# Patient Record
Sex: Male | Born: 1945
Health system: Southern US, Community
[De-identification: ages and names within clinical notes are randomized; demographics above are authoritative.]

## PROBLEM LIST (undated history)

## (undated) DIAGNOSIS — R011 Cardiac murmur, unspecified: Secondary | ICD-10-CM

## (undated) DIAGNOSIS — J45909 Unspecified asthma, uncomplicated: Secondary | ICD-10-CM

## (undated) DIAGNOSIS — M1990 Unspecified osteoarthritis, unspecified site: Secondary | ICD-10-CM

## (undated) DIAGNOSIS — I10 Essential (primary) hypertension: Secondary | ICD-10-CM

## (undated) DIAGNOSIS — Z5189 Encounter for other specified aftercare: Secondary | ICD-10-CM

## (undated) DIAGNOSIS — N529 Male erectile dysfunction, unspecified: Secondary | ICD-10-CM

## (undated) HISTORY — DX: Cardiac murmur, unspecified: R01.1

## (undated) HISTORY — PX: FOOT SURGERY: SHX648

## (undated) HISTORY — DX: Unspecified osteoarthritis, unspecified site: M19.90

## (undated) HISTORY — DX: Unspecified asthma, uncomplicated: J45.909

## (undated) HISTORY — PX: TONSILLECTOMY: SUR1361

## (undated) HISTORY — DX: Encounter for other specified aftercare: Z51.89

## (undated) HISTORY — PX: COLONOSCOPY: SHX174

## (undated) HISTORY — DX: Essential (primary) hypertension: I10

## (undated) HISTORY — PX: PILONIDAL CYST EXCISION: SHX744

## (undated) HISTORY — DX: Male erectile dysfunction, unspecified: N52.9

## (undated) HISTORY — PX: OTHER SURGICAL HISTORY: SHX169

---

## 1996-06-14 HISTORY — PX: CERVICAL DISC SURGERY: SHX588

## 1999-08-18 ENCOUNTER — Encounter (INDEPENDENT_AMBULATORY_CARE_PROVIDER_SITE_OTHER): Payer: Self-pay | Admitting: Internal Medicine

## 1999-08-18 LAB — CONVERTED CEMR LAB: PSA: 1.2 ng/mL

## 2003-08-15 DIAGNOSIS — Z5189 Encounter for other specified aftercare: Secondary | ICD-10-CM

## 2003-08-15 HISTORY — DX: Encounter for other specified aftercare: Z51.89

## 2003-09-15 HISTORY — PX: PELVIC FRACTURE SURGERY: SHX119

## 2003-10-11 ENCOUNTER — Inpatient Hospital Stay (HOSPITAL_COMMUNITY): Admission: EM | Admit: 2003-10-11 | Discharge: 2003-10-19 | Payer: Self-pay | Admitting: Emergency Medicine

## 2003-10-27 ENCOUNTER — Ambulatory Visit (HOSPITAL_COMMUNITY): Admission: RE | Admit: 2003-10-27 | Discharge: 2003-10-27 | Payer: Self-pay | Admitting: General Surgery
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
## 2005-11-09 ENCOUNTER — Ambulatory Visit: Payer: Self-pay | Admitting: Family Medicine

## 2005-11-15 ENCOUNTER — Ambulatory Visit: Payer: Self-pay | Admitting: Family Medicine

## 2005-11-15 ENCOUNTER — Encounter (INDEPENDENT_AMBULATORY_CARE_PROVIDER_SITE_OTHER): Payer: Self-pay | Admitting: Internal Medicine

## 2005-12-12 ENCOUNTER — Ambulatory Visit: Payer: Self-pay | Admitting: Family Medicine

## 2006-06-14 ENCOUNTER — Ambulatory Visit: Payer: Self-pay | Admitting: Family Medicine

## 2006-09-10 ENCOUNTER — Ambulatory Visit: Payer: Self-pay | Admitting: Family Medicine

## 2006-12-05 ENCOUNTER — Ambulatory Visit: Payer: Self-pay | Admitting: Family Medicine

## 2006-12-06 ENCOUNTER — Encounter: Payer: Self-pay | Admitting: Internal Medicine

## 2006-12-06 DIAGNOSIS — I10 Essential (primary) hypertension: Secondary | ICD-10-CM

## 2006-12-10 ENCOUNTER — Ambulatory Visit: Payer: Self-pay | Admitting: Family Medicine

## 2006-12-10 DIAGNOSIS — L02519 Cutaneous abscess of unspecified hand: Secondary | ICD-10-CM | POA: Insufficient documentation

## 2006-12-10 DIAGNOSIS — L03019 Cellulitis of unspecified finger: Secondary | ICD-10-CM

## 2007-08-30 ENCOUNTER — Ambulatory Visit: Payer: Self-pay | Admitting: Family Medicine

## 2007-08-30 DIAGNOSIS — F528 Other sexual dysfunction not due to a substance or known physiological condition: Secondary | ICD-10-CM

## 2007-08-30 DIAGNOSIS — N529 Male erectile dysfunction, unspecified: Secondary | ICD-10-CM | POA: Insufficient documentation

## 2007-08-30 DIAGNOSIS — R42 Dizziness and giddiness: Secondary | ICD-10-CM

## 2007-09-03 ENCOUNTER — Telehealth (INDEPENDENT_AMBULATORY_CARE_PROVIDER_SITE_OTHER): Payer: Self-pay | Admitting: *Deleted

## 2007-09-20 ENCOUNTER — Encounter (INDEPENDENT_AMBULATORY_CARE_PROVIDER_SITE_OTHER): Payer: Self-pay | Admitting: Internal Medicine

## 2008-09-22 ENCOUNTER — Ambulatory Visit: Payer: Self-pay | Admitting: Family Medicine

## 2008-09-22 DIAGNOSIS — D692 Other nonthrombocytopenic purpura: Secondary | ICD-10-CM | POA: Insufficient documentation

## 2008-09-24 LAB — CONVERTED CEMR LAB
ALT: 16 units/L (ref 0–53)
Albumin: 3.7 g/dL (ref 3.5–5.2)
Basophils Relative: 0.6 % (ref 0.0–3.0)
CO2: 32 meq/L (ref 19–32)
Calcium: 9.1 mg/dL (ref 8.4–10.5)
Cholesterol: 226 mg/dL (ref 0–200)
Creatinine, Ser: 1.1 mg/dL (ref 0.4–1.5)
Direct LDL: 150.8 mg/dL
GFR calc Af Amer: 87 mL/min
Glucose, Bld: 92 mg/dL (ref 70–99)
HCT: 37.2 % — ABNORMAL LOW (ref 39.0–52.0)
Hemoglobin: 12.5 g/dL — ABNORMAL LOW (ref 13.0–17.0)
Lymphocytes Relative: 23.7 % (ref 12.0–46.0)
MCHC: 33.5 g/dL (ref 30.0–36.0)
Monocytes Absolute: 0.5 10*3/uL (ref 0.1–1.0)
Monocytes Relative: 10.6 % (ref 3.0–12.0)
Neutro Abs: 2.8 10*3/uL (ref 1.4–7.7)
RBC: 4.1 M/uL — ABNORMAL LOW (ref 4.22–5.81)
Total CHOL/HDL Ratio: 3.7
Total Protein: 6.6 g/dL (ref 6.0–8.3)
VLDL: 8 mg/dL (ref 0–40)
aPTT: 28.9 s (ref 21.7–29.8)

## 2009-03-23 ENCOUNTER — Ambulatory Visit: Payer: Self-pay | Admitting: Family Medicine

## 2009-03-24 LAB — CONVERTED CEMR LAB
CO2: 32 meq/L (ref 19–32)
Calcium: 9 mg/dL (ref 8.4–10.5)
Chloride: 103 meq/L (ref 96–112)
Potassium: 4.5 meq/L (ref 3.5–5.1)
Sodium: 141 meq/L (ref 135–145)
Total CHOL/HDL Ratio: 4

## 2009-07-28 ENCOUNTER — Encounter (INDEPENDENT_AMBULATORY_CARE_PROVIDER_SITE_OTHER): Payer: Self-pay | Admitting: *Deleted

## 2009-09-28 ENCOUNTER — Ambulatory Visit: Payer: Self-pay | Admitting: Family Medicine

## 2009-09-29 LAB — CONVERTED CEMR LAB
Albumin: 3.9 g/dL (ref 3.5–5.2)
Bilirubin, Direct: 0.1 mg/dL (ref 0.0–0.3)
CO2: 32 meq/L (ref 19–32)
Calcium: 9.2 mg/dL (ref 8.4–10.5)
Cholesterol: 217 mg/dL — ABNORMAL HIGH (ref 0–200)
Creatinine, Ser: 1.2 mg/dL (ref 0.4–1.5)
Direct LDL: 143.6 mg/dL
HDL: 69.2 mg/dL (ref >39.00)
Total CHOL/HDL Ratio: 3
Total Protein: 6.8 g/dL (ref 6.0–8.3)
Triglycerides: 29 mg/dL (ref 0.0–149.0)

## 2010-03-30 ENCOUNTER — Telehealth: Payer: Self-pay | Admitting: Gastroenterology

## 2010-09-13 NOTE — Progress Notes (Signed)
Summary: Schedule Colonoscopy  Phone Note Outgoing Call Call back at Home Phone [phone]   Call placed by: Harlow Mares CMA [facility]),  March 30, 2010 11:15 AM Call placed to: Patient Summary of Call: spoke to the patients wife and she will have the patient call back to schedule his colonoscopy i gave her the number and Dr. Anselm Jungling number.  Initial call taken by: Harlow Mares CMA [facility]),  March 30, 2010 11:16 AM

## 2010-09-13 NOTE — Assessment & Plan Note (Signed)
Summary: F/U,Scott Brennan/CLE   Vital Signs:  Patient profile:   65 year old male Height:      70.25 inches Weight:      177.25 pounds BMI:     25.34 Temp:     98.2 degrees F oral Pulse rate:   84 / minute Pulse rhythm:   regular BP sitting:   142 / 62  (left arm) Cuff size:   regular  Vitals Entered By: Scott Brennan CMA Scott Brennan) (September 28, 2009 8:17 AM) CC: Follow-up visit  (BDB)   History of Present Illness: Here for follow up of HTN.  HTN- mildly elevated today.  Gave blood two days ago and BP was checked multiple times, each time 120s/70s-80s.  No CP, SOB, blurred vision, or LE edema. On lisinopirl 5 mg daily.  Well man- due for FLP, Zostavax, colonscopy PSA. No family h/o colon CA.  No changes in bowels or blood in stool.  No changes in weight.  Very active man.  Walks 2.5 miles per day.  Quit smoking in 2005.  ED- has not used Viagra in years.  Not interested in refills.     Current Medications (verified): 1)  Lisinopril 5 Mg Tabs (Lisinopril) .... Take  One By Mouth Daily 2)  Viagra 100 Mg  Tabs (Sildenafil Citrate) .Marland Kitchen.. 1 Once Daily As Needed  Allergies: 1)  ! * Ivp Dye  Review of Systems      See HPI General:  Denies malaise. Eyes:  Denies blurring. ENT:  Denies difficulty swallowing. CV:  Denies chest pain or discomfort. Resp:  Denies shortness of breath. GI:  Denies abdominal pain. Derm:  Denies rash. Neuro:  Denies headaches. Psych:  Denies anxiety and depression.  Physical Exam  General:  alert, well-developed, well-nourished, and well-hydrated.   Eyes:  No corneal or conjunctival inflammation noted. EOMI. Perrla. Funduscopic exam benign, without hemorrhages, exudates or papilledema. Vision grossly normal. Mouth:  MMM Lungs:  normal respiratory effort, no intercostal retractions, no accessory muscle use, and normal breath sounds.   Heart:  normal rate, regular rhythm, and no murmur.   Extremities:  no edema Psych:  normally interactive and  good eye contact.     Impression & Recommendations:  Problem # 1:  HYPERTENSION (ICD-401.9) Assessment Unchanged mildly elevated today.  Advised to keep a log and call me with BP readings over the next couple of weeks.  Has a lot of room to go up on BP meds, on low dose Lisinopril currently. His updated medication list for this problem includes:    Lisinopril 5 Mg Tabs (Lisinopril) .Marland Kitchen... Take  one by mouth daily  Orders: Venipuncture (46962) TLB-BMP (Basic Metabolic Panel-BMET) (80048-METABOL)  Problem # 2:  SCREENING FOR LIPOID DISORDERS (ICD-V77.91) FLP today Orders: Venipuncture (95284) TLB-Lipid Panel (80061-LIPID) TLB-Hepatic/Liver Function Pnl (80076-HEPATIC)  Problem # 3:  SPECIAL SCREENING MALIGNANT NEOPLASM OF PROSTATE (ICD-V76.44) PSA today Orders: Venipuncture (13244) TLB-PSA (Prostate Specific Antigen) (84153-PSA)  Problem # 4:  Preventive Health Care (ICD-V70.0) Assessment: Comment Only due for colonoscopy, says he will do it in a few months.  Does not want to schedule right now.  Complete Medication List: 1)  Lisinopril 5 Mg Tabs (Lisinopril) .... Take  one by mouth daily 2)  Viagra 100 Mg Tabs (Sildenafil citrate) .Marland Kitchen.. 1 once daily as needed Prescriptions: LISINOPRIL 5 MG TABS (LISINOPRIL) Take  one by mouth daily  #30 x 6   Entered and Authorized by:   [name] MD   Signed by:  [name] MD on 09/28/2009   Method used:   [facility]        [facility] (retail[facility]       [address]       [place], [place]  [address]       Ph: [phone] or [phone]       Fax: [phone]   RxID:   [id number]   Current Allergies (reviewed today): ! * IVP DYE Colonoscopy Next Due:  Refused

## 2010-10-14 ENCOUNTER — Encounter: Payer: Self-pay | Admitting: Family Medicine

## 2010-10-14 ENCOUNTER — Ambulatory Visit (INDEPENDENT_AMBULATORY_CARE_PROVIDER_SITE_OTHER): Payer: BC Managed Care – PPO | Admitting: Family Medicine

## 2010-10-14 DIAGNOSIS — Z1211 Encounter for screening for malignant neoplasm of colon: Secondary | ICD-10-CM

## 2010-10-14 DIAGNOSIS — I10 Essential (primary) hypertension: Secondary | ICD-10-CM

## 2010-10-17 LAB — CONVERTED CEMR LAB
ALT: 15 units/L (ref 0–53)
AST: 23 units/L (ref 0–37)
Chloride: 101 meq/L (ref 96–112)
Creatinine, Ser: 1.05 mg/dL (ref 0.40–1.50)
LDL Cholesterol: 149 mg/dL — ABNORMAL HIGH (ref 0–99)
PSA: 0.69 ng/mL (ref <=4.00)
Total Bilirubin: 0.6 mg/dL (ref 0.3–1.2)
Total CHOL/HDL Ratio: 3.6
VLDL: 9 mg/dL (ref 0–40)

## 2010-10-18 ENCOUNTER — Encounter (INDEPENDENT_AMBULATORY_CARE_PROVIDER_SITE_OTHER): Payer: Self-pay | Admitting: *Deleted

## 2010-10-18 ENCOUNTER — Other Ambulatory Visit: Payer: Self-pay | Admitting: Family Medicine

## 2010-10-18 ENCOUNTER — Other Ambulatory Visit: Payer: BC Managed Care – PPO

## 2010-10-18 DIAGNOSIS — R195 Other fecal abnormalities: Secondary | ICD-10-CM

## 2010-10-18 LAB — FECAL OCCULT BLOOD, IMMUNOCHEMICAL: Fecal Occult Bld: NEGATIVE

## 2010-10-19 ENCOUNTER — Encounter: Payer: Self-pay | Admitting: Family Medicine

## 2010-10-20 NOTE — Letter (Signed)
Summary: [facility]: Immunoassay Fecal Occult Blood (iFOB) Order Form  [place] [facility]  [address] [place], [place] [address]   Phone: [phone]  Fax: [phone]       Lab: Immunoassay Fecal Occult Blood (iFOB) Order Form   October 14, 2010 MRN: [id number]   Scott Brennan 09-Dec-1945   Physicican Name:____Talia Aron_____________________  Diagnosis Code:_______792.1___________________      [name] MD

## 2010-10-20 NOTE — Assessment & Plan Note (Signed)
Summary: REFILL MEDICATIONS/CLE   [employer]   Vital Signs:  Patient profile:   65 year old male Height:      70.25 inches Weight:      179 pounds BMI:     25.59 Temp:     98.7 degrees F oral Pulse rate:   83 / minute Pulse rhythm:   regular BP sitting:   130 / 80  (right arm) Cuff size:   regular  Vitals Entered By: Linde Gillis CMA [facility]) (October 14, 2010 2:52 PM) CC: medication refill   History of Present Illness: Here for follow up of HTN.  HTN- Is normotensive today.    Gives blood frequently and  BP has been running 120s/-130s70s-80s.  No CP, SOB, blurred vision, or LE edema. On lisinopirl 5 mg daily.  Well man- due for FLP, Zostavax, colonscopy PSA. No family h/o colon CA.  No changes in bowels or blood in stool.  No changes in weight.  Referred him to GI last year but he is still very hesitant to have a colonscopy.    Very active man.  Walks 2.5 miles per day.  Quit smoking in 2005.  ED- has not used Viagra in years.  Not interested in refills.    Foot surgery- has bunyon removal and bone surgery 3 weeks ago, healing well.  Had follow up with [facility] yesterday(awaiting records)   Current Medications (verified): 1)  Lisinopril 5 Mg Tabs (Lisinopril) .... Take  One By Mouth Daily 2)  Viagra 100 Mg  Tabs (Sildenafil Citrate) .Marland Kitchen.. 1 Once Daily As Needed  Allergies: 1)  ! * Ivp Dye  Past History:  Past Medical History: Last updated: 12/06/2006 Hypertension  Past Surgical History: Last updated: 12/06/2006 Cervical disk surgery 11/97 Pilonidal cyst removed Hx fracturedpelvis, ribs, and punctured spleen(repaired)---2/05  Social History: Last updated: 09/22/2008 Marital Status: Married Children: 2--1 grand child Occupation: works 40h/wk at [employer]  Risk Factors: Caffeine Use: 5+ (09/22/2008) Exercise: no (09/22/2008)  Risk Factors: Smoking Status: quit (12/06/2006) Passive Smoke Exposure: no (09/22/2008)  Review of Systems      See  HPI General:  Denies malaise. Eyes:  Denies blurring. ENT:  Denies difficulty swallowing. CV:  Denies chest pain or discomfort. Resp:  Denies shortness of breath. GI:  Denies abdominal pain, bloody stools, and change in bowel habits. GU:  Denies dysuria, urinary frequency, and urinary hesitancy. MS:  Denies joint pain, joint redness, and joint swelling. Derm:  Denies rash. Neuro:  Denies headaches. Psych:  Denies anxiety and depression. Endo:  Denies cold intolerance and heat intolerance. Heme:  Denies abnormal bruising and bleeding.  Physical Exam  General:  alert, well-developed, well-nourished, and well-hydrated.   Head:  normocephalic and atraumatic.   Eyes:  No corneal or conjunctival inflammation noted. EOMI. Perrla. Funduscopic exam benign, without hemorrhages, exudates or papilledema. Vision grossly normal. Ears:  R ear normal and L ear normal.   Nose:  no external deformity.   Mouth:  MMM Neck:  No deformities, masses, or tenderness noted. Lungs:  normal respiratory effort, no intercostal retractions, no accessory muscle use, and normal breath sounds.   Heart:  normal rate, regular rhythm, and no murmur.   Abdomen:  Bowel sounds positive,abdomen soft and non-tender without masses, organomegaly or hernias noted. Rectal:  deferred- pt does want PSA Genitalia:  deferred Prostate:  deferred Msk:  in right foot boot Neurologic:  alert & oriented X3.   Skin:  Intact without suspicious lesions or rashes  Psych:  normally interactive and good eye contact.     Impression & Recommendations:  Problem # 1:  SPECIAL SCREENING MALIGNANT NEOPLASM OF PROSTATE (ICD-V76.44) Asymptomatic, will check PSA today.   Orders: T-PSA ([address])  Problem # 2:  HYPERTENSION (ICD-401.9) Assessment: Improved Has been normotensive on such a low dose and was very active prior to his foot surgery.  He anticpates returing to his normal physical activites soon.  Pt would like to try to stop the  Lisinopril and I agree that we can try that.  check BMET today. See pt instructions for details. His updated medication list for this problem includes:    Lisinopril 5 Mg Tabs (Lisinopril) .Marland Kitchen... Take  one by mouth daily  Orders: T-Comprehensive Metabolic Panel (09811-91478)  Problem # 3:  SPECIAL SCREENING FOR MALIGNANT NEOPLASMS COLON (ICD-V76.51) pt still would prefer not to have a colonscopy. Will order stool cards instead, pt does agree to those.  Complete Medication List: 1)  Lisinopril 5 Mg Tabs (Lisinopril) .... Take  one by mouth daily 2)  Viagra 100 Mg Tabs (Sildenafil citrate) .Marland Kitchen.. 1 once daily as needed  Other Orders: T-Lipid Profile ([address]-13086)  Patient Instructions: 1)  Good to see you. 2)  Let's stop your Lisinopril.  3)  Check your blood pressure at work a couple of times a week and give me a call. Prescriptions: LISINOPRIL 5 MG TABS (LISINOPRIL) Take  one by mouth daily  #30 x 11   Entered and Authorized by:   [name] MD   Signed by:   [name] MD on 10/14/2010   Method used:   [facility] [id number]       [address]       [place], [place]  [address]       Ph: [phone] or [phone]       Fax: [phone]   RxID:   [id number]    Orders Added: 1)  T-Lipid Profile [phone] 2)  T-Comprehensive Metabolic Panel [80053-22900] 3)  T-PSA [[address]-35573] 4)  Est. Patient Level IV [22025]    Current Allergies (reviewed today): ! * IVP DYE

## 2010-10-25 NOTE — Letter (Signed)
Summary: Results Follow up Letter   at [facility]  [address] [place], [place] [address]   Phone: [phone]  Fax: [phone]    10/19/2010 MRN: [id number]  Scott Brennan [address] [place], [place]  [address]  [place]  Dear [name],  The following are the results of your recent test(s):  Test         Result    Pap Smear:        Normal _____  Not Normal _____ Comments: ______________________________________________________ Cholesterol: LDL(Bad cholesterol):         Your goal is less than:         HDL (Good cholesterol):       Your goal is more than: Comments:  ______________________________________________________ Mammogram:        Normal _____  Not Normal _____ Comments:  ___________________________________________________________________ Hemoccult:        Normal __X___  Not normal _______ Comments:    _____________________________________________________________________ Other Tests:    We routinely do not discuss normal results over the telephone.  If you desire a copy of the results, or you have any questions about this information we can discuss them at your next office visit.   Sincerely,      Dr. [name]

## 2010-11-14 ENCOUNTER — Encounter: Payer: BC Managed Care – PPO | Admitting: Cardiology

## 2010-11-14 ENCOUNTER — Other Ambulatory Visit: Payer: Self-pay | Admitting: Podiatry

## 2010-11-14 ENCOUNTER — Encounter (INDEPENDENT_AMBULATORY_CARE_PROVIDER_SITE_OTHER): Payer: BC Managed Care – PPO | Admitting: Cardiology

## 2010-11-14 DIAGNOSIS — M7989 Other specified soft tissue disorders: Secondary | ICD-10-CM

## 2010-11-21 ENCOUNTER — Encounter: Payer: Self-pay | Admitting: Podiatry
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
## 2012-10-15 ENCOUNTER — Encounter: Payer: Self-pay | Admitting: Family Medicine

## 2012-10-15 ENCOUNTER — Ambulatory Visit (INDEPENDENT_AMBULATORY_CARE_PROVIDER_SITE_OTHER): Payer: BC Managed Care – PPO | Admitting: Family Medicine

## 2012-10-15 VITALS — BP 160/94 | HR 64 | Temp 98.0°F | Wt 181.5 lb

## 2012-10-15 DIAGNOSIS — H60399 Other infective otitis externa, unspecified ear: Secondary | ICD-10-CM

## 2012-10-15 DIAGNOSIS — H6092 Unspecified otitis externa, left ear: Secondary | ICD-10-CM

## 2012-10-15 DIAGNOSIS — J019 Acute sinusitis, unspecified: Secondary | ICD-10-CM

## 2012-10-15 MED ORDER — AMOXICILLIN-POT CLAVULANATE 875-125 MG PO TABS: 1.0000 | ORAL_TABLET | Freq: Two times a day (BID) | ORAL | Status: AC

## 2012-10-15 MED ORDER — HYDROCORTISONE-ACETIC ACID 1-2 % OT SOLN: 5.0000 [drp] | Freq: Two times a day (BID) | OTIC | Status: DC

## 2012-10-15 NOTE — Patient Instructions (Signed)
Stop decongestant. Let's keep an eye on blood pressure - if consistently elevated >140/90, please return to see Dr. Dayton Martes. You have a sinus infection as well as left outer ear infection. Take medicine as prescribed: augmentin for 10 days.  vosol hc ear drops into left ear. Push fluids and plenty of rest. Nasal saline irrigation or neti pot to help drain sinuses. If more stopped up, may use simple mucinex or immediate release guaifenesin with plenty of fluid to help mobilize mucous. Let us know if fever >101.5, trouble opening/closing mouth, difficulty swallowing, or worsening - you may need to be seen again.

## 2012-10-15 NOTE — Progress Notes (Signed)
  Subjective:    Patient ID: Scott Brennan, male    DOB: September 19, 1945, 67 y.o.   MRN: [id number]  HPI CC: ?sinusitis  Pt of Dr. Elmer Sow, not seen here since 09/2010.  BP elevated today - attributes to decongestant he's been using.  Advised to stop decongestant.  Off and on illness for last several weeks, never fully better.  Latest illness started 1 week ago.  Earache on left associated with muffled hearing, tooth ache, head congestion, rhinorrhea, blowing nose.  Mild cough with PNDrainage.   No fevers/chills, abd pain, nausea/vomiting. Wife says she can smell sinus infection. Recently saw dentist.  So far has tried decongestant.  Sick contacts at work. No smokers at home. h/o asthma growing up.  Past Medical History  Diagnosis Date  . HTN (hypertension)   . ED (erectile dysfunction)      Review of Systems Per HPI    Objective:   Physical Exam  Nursing note and vitals reviewed. Constitutional: He appears well-developed and well-nourished. No distress.  HENT:  Head: Normocephalic and atraumatic.  Right Ear: Hearing, tympanic membrane, external ear and ear canal normal.  Left Ear: Hearing and external ear normal.  Nose: Rhinorrhea present. No mucosal edema. Right sinus exhibits no maxillary sinus tenderness and no frontal sinus tenderness. Left sinus exhibits no maxillary sinus tenderness and no frontal sinus tenderness.  Mouth/Throat: Uvula is midline, oropharynx is clear and moist and mucous membranes are normal. No oropharyngeal exudate, posterior oropharyngeal edema (mild), posterior oropharyngeal erythema or tonsillar abscesses.  L external canal markedly swollen and slightly erythematous. TM seems clear but difficult to visualize fully 2/2 swelling.  Eyes: Conjunctivae and EOM are normal. Pupils are equal, round, and reactive to light. No scleral icterus.  Neck: Normal range of motion. Neck supple.  Cardiovascular: Normal rate, regular rhythm, normal heart sounds and  intact distal pulses.   No murmur heard. Pulmonary/Chest: Effort normal and breath sounds normal. No respiratory distress. He has no wheezes. He has no rales.  Somewhat distant  Lymphadenopathy:    He has no cervical adenopathy.  Skin: Skin is warm and dry. No rash noted.       Assessment & Plan:

## 2012-10-15 NOTE — Assessment & Plan Note (Signed)
With external otitis. Given duration and progression (several weeks, not ever fully better), will treat aggressively with augmentin oral vosol hc to L ear given edema.

## 2015-03-25 ENCOUNTER — Encounter: Payer: Self-pay | Admitting: Internal Medicine

## 2015-03-25 ENCOUNTER — Ambulatory Visit (INDEPENDENT_AMBULATORY_CARE_PROVIDER_SITE_OTHER): Payer: BLUE CROSS/BLUE SHIELD | Admitting: Internal Medicine

## 2015-03-25 VITALS — BP 136/84 | HR 63 | Temp 98.1°F | Wt 177.0 lb

## 2015-03-25 DIAGNOSIS — J069 Acute upper respiratory infection, unspecified: Secondary | ICD-10-CM

## 2015-03-25 DIAGNOSIS — B9789 Other viral agents as the cause of diseases classified elsewhere: Principal | ICD-10-CM

## 2015-03-25 NOTE — Progress Notes (Signed)
Pre visit review using our clinic review tool, if applicable. No additional management support is needed unless otherwise documented below in the visit note. 

## 2015-03-25 NOTE — Progress Notes (Signed)
HPI  Pt presents to the clinic today with c/o runny nose and cough. This started 2 days ago. He is blowing clear mucous out of his nose. He feels like he has runny a fever because he has had chills and night sweats but he has not actually measured his temperature. The cough is nonproductive. He denies ear pain or sore throat. He has take Benadryl and is not sure if it has helped. He reports his wife has also been sneezing and coughing. He has no history of seasonal allergies.  Review of Systems      Past Medical History  Diagnosis Date  . HTN (hypertension)   . ED (erectile dysfunction)     No family history on file.  Social History   Social History  . Marital Status: Married    Spouse Name: N/A  . Number of Children: 2  . Years of Education: N/A   Occupational History  .  [employer]   Social History Main Topics  . Smoking status: Former Smoker    Start date: 09/15/2003  . Smokeless tobacco: Not on file  . Alcohol Use: No  . Drug Use: No  . Sexual Activity: Not on file   Other Topics Concern  . Not on file   Social History Narrative   Married      2 children; 1 grandchild      [employer] (40 hr/week)          Allergies  Allergen Reactions  . Iohexol      Constitutional:  Denies headache, fatigue, fever or abrupt weight changes.  HEENT:  Positive runny nose. Denies eye redness, eye pain, pressure behind the eyes, facial pain, nasal congestion, ear pain, ringing in the ears, wax buildup, runny nose or sore throat. Respiratory: Positive cough. Denies difficulty breathing or shortness of breath.  Cardiovascular: Denies chest pain, chest tightness, palpitations or swelling in the hands or feet.   No other specific complaints in a complete review of systems (except as listed in HPI above).  Objective:   BP 136/84 mmHg  Pulse 63  Temp(Src) 98.1 F (36.7 C) (Oral)  Wt 177 lb (80.287 kg)  SpO2 98% Wt Readings from Last 3 Encounters:   03/25/15 177 lb (80.287 kg)  10/15/12 181 lb 8 oz (82.328 kg)  10/14/10 179 lb (81.194 kg)     General: Appears his stated age, in NAD. HEENT: Head: normal shape and size, no sinus tenderness noted; Eyes: sclera white, no icterus, conjunctiva pink, PERRLA and EOMs intact; Ears: Tm's gray and intact, normal light reflex; Nose: mucosa pink and moist, septum midline; Throat/Mouth: + PND. Teeth present, mucosa pink and moist, no exudate noted, no lesions or ulcerations noted.  Neck: No cervical lymphadenopathy.  Cardiovascular: Normal rate and rhythm. S1,S2 noted.  No murmur, rubs or gallops noted.  Pulmonary/Chest: Normal effort and positive vesicular breath sounds. No respiratory distress. No wheezes, rales or ronchi noted.      Assessment & Plan:   Upper Respiratory Infection:  Viral Get some rest and drink plenty of water Try Flonase twice a day for the next 3 days Ibuprofen as needed for suspected fever He declines RX for cough syrup at this time  RTC as needed or if symptoms persist.

## 2015-03-25 NOTE — Patient Instructions (Signed)
Upper Respiratory Infection, Adult An upper respiratory infection (URI) is also sometimes known as the common cold. The upper respiratory tract includes the nose, sinuses, throat, trachea, and bronchi. Bronchi are the airways leading to the lungs. Most people improve within 1 week, but symptoms can last up to 2 weeks. A residual cough may last even longer.  CAUSES Many different viruses can infect the tissues lining the upper respiratory tract. The tissues become irritated and inflamed and often become very moist. Mucus production is also common. A cold is contagious. You can easily spread the virus to others by oral contact. This includes kissing, sharing a glass, coughing, or sneezing. Touching your mouth or nose and then touching a surface, which is then touched by another person, can also spread the virus. SYMPTOMS  Symptoms typically develop 1 to 3 days after you come in contact with a cold virus. Symptoms vary from person to person. They may include:  Runny nose.  Sneezing.  Nasal congestion.  Sinus irritation.  Sore throat.  Loss of voice (laryngitis).  Cough.  Fatigue.  Muscle aches.  Loss of appetite.  Headache.  Low-grade fever. DIAGNOSIS  You might diagnose your own cold based on familiar symptoms, since most people get a cold 2 to 3 times a year. Your caregiver can confirm this based on your exam. Most importantly, your caregiver can check that your symptoms are not due to another disease such as strep throat, sinusitis, pneumonia, asthma, or epiglottitis. Blood tests, throat tests, and X-rays are not necessary to diagnose a common cold, but they may sometimes be helpful in excluding other more serious diseases. Your caregiver will decide if any further tests are required. RISKS AND COMPLICATIONS  You may be at risk for a more severe case of the common cold if you smoke cigarettes, have chronic heart disease (such as heart failure) or lung disease (such as asthma), or if  you have a weakened immune system. The very young and very old are also at risk for more serious infections. Bacterial sinusitis, middle ear infections, and bacterial pneumonia can complicate the common cold. The common cold can worsen asthma and chronic obstructive pulmonary disease (COPD). Sometimes, these complications can require emergency medical care and may be life-threatening. PREVENTION  The best way to protect against getting a cold is to practice good hygiene. Avoid oral or hand contact with people with cold symptoms. Wash your hands often if contact occurs. There is no clear evidence that vitamin C, vitamin E, echinacea, or exercise reduces the chance of developing a cold. However, it is always recommended to get plenty of rest and practice good nutrition. TREATMENT  Treatment is directed at relieving symptoms. There is no cure. Antibiotics are not effective, because the infection is caused by a virus, not by bacteria. Treatment may include:  Increased fluid intake. Sports drinks offer valuable electrolytes, sugars, and fluids.  Breathing heated mist or steam (vaporizer or shower).  Eating chicken soup or other clear broths, and maintaining good nutrition.  Getting plenty of rest.  Using gargles or lozenges for comfort.  Controlling fevers with ibuprofen or acetaminophen as directed by your caregiver.  Increasing usage of your inhaler if you have asthma. Zinc gel and zinc lozenges, taken in the first 24 hours of the common cold, can shorten the duration and lessen the severity of symptoms. Pain medicines may help with fever, muscle aches, and throat pain. A variety of non-prescription medicines are available to treat congestion and runny nose. Your caregiver   can make recommendations and may suggest nasal or lung inhalers for other symptoms.  HOME CARE INSTRUCTIONS   Only take over-the-counter or prescription medicines for pain, discomfort, or fever as directed by your  caregiver.  Use a warm mist humidifier or inhale steam from a shower to increase air moisture. This may keep secretions moist and make it easier to breathe.  Drink enough water and fluids to keep your urine clear or pale yellow.  Rest as needed.  Return to work when your temperature has returned to normal or as your caregiver advises. You may need to stay home longer to avoid infecting others. You can also use a face mask and careful hand washing to prevent spread of the virus. SEEK MEDICAL CARE IF:   After the first few days, you feel you are getting worse rather than better.  You need your caregiver's advice about medicines to control symptoms.  You develop chills, worsening shortness of breath, or brown or red sputum. These may be signs of pneumonia.  You develop yellow or brown nasal discharge or pain in the face, especially when you bend forward. These may be signs of sinusitis.  You develop a fever, swollen neck glands, pain with swallowing, or white areas in the back of your throat. These may be signs of strep throat. SEEK IMMEDIATE MEDICAL CARE IF:   You have a fever.  You develop severe or persistent headache, ear pain, sinus pain, or chest pain.  You develop wheezing, a prolonged cough, cough up blood, or have a change in your usual mucus (if you have chronic lung disease).  You develop sore muscles or a stiff neck. Document Released: 01/24/2001 Document Revised: 10/23/2011 Document Reviewed: 11/05/2013 ExitCare Patient Information 2015 [employer]. This information is not intended to replace advice given to you by your health care provider. Make sure you discuss any questions you have with your health care provider.  

## 2015-10-01 ENCOUNTER — Encounter: Payer: Self-pay | Admitting: Primary Care

## 2015-10-01 ENCOUNTER — Ambulatory Visit (INDEPENDENT_AMBULATORY_CARE_PROVIDER_SITE_OTHER): Payer: BLUE CROSS/BLUE SHIELD | Admitting: Primary Care

## 2015-10-01 VITALS — BP 138/82 | HR 60 | Temp 97.6°F | Wt 177.8 lb

## 2015-10-01 DIAGNOSIS — H1013 Acute atopic conjunctivitis, bilateral: Secondary | ICD-10-CM | POA: Diagnosis not present

## 2015-10-01 MED ORDER — OLOPATADINE HCL 0.1 % OP SOLN: 1.0000 [drp] | Freq: Two times a day (BID) | OPHTHALMIC | Status: DC

## 2015-10-01 NOTE — Progress Notes (Signed)
Pre visit review using our clinic review tool, if applicable. No additional management support is needed unless otherwise documented below in the visit note. 

## 2015-10-01 NOTE — Progress Notes (Signed)
   Subjective:    Patient ID: Scott Brennan, male    DOB: 03-25-1946, 70 y.o.   MRN: [id number]  HPI  Scott Brennan is a 70 year old male who presents today with a chief complaint of bilateral eye swelling. He also reports redness, itching, and tearing from his eyes. He also reports sore throat and sneezing. His symptoms have been present for 1 week. He's taken benadryl once daily and using OTC allergy eye drops without improvement. Denies cough, fevers, chills, fatigue, green or yellow drainage.   Review of Systems  Constitutional: Negative for fever, chills and fatigue.  HENT: Positive for postnasal drip, sneezing and sore throat. Negative for ear pain.   Eyes: Positive for redness and itching.       Watery eyes  Gastrointestinal: Negative for nausea.  Musculoskeletal: Negative for myalgias.       Past Medical History  Diagnosis Date  . HTN (hypertension)   . ED (erectile dysfunction)     Social History   Social History  . Marital Status: Married    Spouse Name: N/A  . Number of Children: 2  . Years of Education: N/A   Occupational History  .  [employer]   Social History Main Topics  . Smoking status: Former Smoker    Start date: 09/15/2003  . Smokeless tobacco: Not on file  . Alcohol Use: No  . Drug Use: No  . Sexual Activity: Not on file   Other Topics Concern  . Not on file   Social History Narrative   Married      2 children; 1 grandchild      [employer] (40 hr/week)          Past Surgical History  Procedure Laterality Date  . Cervical disc surgery  11/97  . Pilonidal cyst excision    . Pelvic fracture surgery  09/2003    No family history on file.  Allergies  Allergen Reactions  . Iohexol     No current outpatient prescriptions on file prior to visit.   No current facility-administered medications on file prior to visit.    BP 138/82 mmHg  Pulse 60  Temp(Src) 97.6 F (36.4 C) (Oral)  Wt 177 lb 12.8 oz (80.65 kg)   SpO2 99%    Objective:   Physical Exam  Constitutional: He appears well-nourished.  HENT:  Right Ear: Tympanic membrane and ear canal normal.  Left Ear: Tympanic membrane and ear canal normal.  Nose: Right sinus exhibits no maxillary sinus tenderness and no frontal sinus tenderness. Left sinus exhibits no maxillary sinus tenderness and no frontal sinus tenderness.  Mouth/Throat: Oropharynx is clear and moist.  Eyes: Pupils are equal, round, and reactive to light. Right eye exhibits no discharge and no exudate. Left eye exhibits no discharge and no exudate. Right conjunctiva is injected. Left conjunctiva is injected.  Neck: Neck supple.  Cardiovascular: Normal rate and regular rhythm.   Pulmonary/Chest: Effort normal and breath sounds normal.  Skin: Skin is warm and dry.          Assessment & Plan:  Allergic Conjunctivitis:  Bilateral eye redness, tearing, itching x 1 week. No cough, fatigue, fevers, nausea. Eyes with mild redness, no exudate or drainage. Suspect allergic conjunctivitis. Start Patanol eye drops and Claritin daily. Follow up PRN.

## 2015-10-01 NOTE — Patient Instructions (Signed)
Start olopatadine drops. Instill 1 drop into both eyes twice daily.  Start a daily antihistamine such as Claritin or Zyrtec.   Nasal Congestion: Try using Flonase (fluticasone) nasal spray. Instill 2 sprays in each nostril once daily.   Please notify us if no improvement in symptoms.  It was a pleasure to meet you today!  Allergic Conjunctivitis Allergic conjunctivitis is inflammation of the clear membrane that covers the white part of your eye and the inner surface of your eyelid (conjunctiva), and it is caused by allergies. The blood vessels in the conjunctiva become inflamed, and this causes the eye to become red or pink, and it often causes itchiness in the eye. Allergic conjunctivitis cannot be spread by one person to another person (noncontagious). CAUSES This condition is caused by an allergic reaction. Common causes of an allergic reaction (allergens) include:  Dust.  Pollen.  Mold.  Animal dander or secretions. RISK FACTORS This condition is more likely to develop if you are exposed to high levels of allergens that cause the allergic reaction. This might include being outdoors when air pollen levels are high or being around animals that you are allergic to. SYMPTOMS Symptoms of this condition may include:  Eye redness.  Tearing of the eyes.  Watery eyes.  Itchy eyes.  Burning feeling in the eyes.  Clear drainage from the eyes.  Swollen eyelids. DIAGNOSIS This condition may be diagnosed by medical history and physical exam. If you have drainage from your eyes, it may be tested to rule out other causes of conjunctivitis. TREATMENT Treatment for this condition often includes medicines. These may be eye drops, ointments, or oral medicines. They may be prescription medicines or over-the-counter medicines. HOME CARE INSTRUCTIONS  Take or apply medicines only as directed by your health care provider.  Do not touch or rub your eyes.  Do not wear contact lenses until  the inflammation is gone. Wear glasses instead.  Do not wear eye makeup until the inflammation is gone.  Apply a cool, clean washcloth to your eye for 10-20 minutes, 3-4 times a day.  Try to avoid whatever allergen is causing the allergic reaction. SEEK MEDICAL CARE IF:  Your symptoms get worse.  You have pus draining from your eye.  You have new symptoms.  You have a fever.   This information is not intended to replace advice given to you by your health care provider. Make sure you discuss any questions you have with your health care provider.   Document Released: 10/21/2002 Document Revised: 08/21/2014 Document Reviewed: 05/12/2014 Elsevier Interactive Patient Education [employer].

## 2016-09-22 ENCOUNTER — Telehealth: Payer: Self-pay

## 2016-09-22 NOTE — Telephone Encounter (Signed)
Noted.  I have not seen patient in 5 years.  Ok to re establish with me.

## 2016-09-22 NOTE — Telephone Encounter (Signed)
Patient sent over by [facility] after having blood drawn this am and bp readings 180/98 with a recheck of 176/98.  He is asymptomatic otherwise and reports feeling fine.    This Probation officer triages patient: V/S: 174/80 Apical HR 56 - 60bpm regular rate and rhythm.  Resp 16.    Generally healthy 71 y/o male with hx of HTN Currently only takes R/X eye drops  15yrs ago he was taken off of Lisinopril and hasn't needed med management since.  Walks 5 miles daily without difficulty, has noticed some Bilateral shoulder soreness.  No Cp, SOB, Ha, dizziness or visual changes No s/s of edema to extremities Neuro checks all WNL  Patient reports feeling fine and is not concerned.  Consulted with Dr. Damita Dunnings who recommends patient record blood pressures at home in comfortable environment during the weekend and call us Monday for follow up with PCP Dr. Deborra Medina who is out of the office today.  Patient verbalizes understanding and agreement with plan and will call Monday for follow up.  He is aware that if any concerning symptoms develop over weekend: ie, bp continues to elevate, HA SOB CP numbness or tingling etc... He will go to the Er.

## 2016-09-25 NOTE — Telephone Encounter (Signed)
Spoke with patient. His blood pressures have been coming down since Friday and he feels fine.  He will continue to collect readings and bring to re-establish appointment tomorrow (09/26/16 at 11:30) with Dr. Deborra Medina.    He will arrive at least 15 minutes early to have time to updated his paperwork.  Patient verbalizes understanding.

## 2016-09-26 ENCOUNTER — Encounter: Payer: Self-pay | Admitting: Family Medicine

## 2016-09-26 ENCOUNTER — Ambulatory Visit (INDEPENDENT_AMBULATORY_CARE_PROVIDER_SITE_OTHER): Payer: Medicare Other | Admitting: Family Medicine

## 2016-09-26 VITALS — BP 146/88 | HR 54 | Temp 97.6°F | Wt 175.5 lb

## 2016-09-26 DIAGNOSIS — Z0000 Encounter for general adult medical examination without abnormal findings: Secondary | ICD-10-CM

## 2016-09-26 DIAGNOSIS — I10 Essential (primary) hypertension: Secondary | ICD-10-CM

## 2016-09-26 LAB — COMPREHENSIVE METABOLIC PANEL
ALT: 16 U/L (ref 0–53)
AST: 25 U/L (ref 0–37)
Albumin: 4.2 g/dL (ref 3.5–5.2)
Alkaline Phosphatase: 59 U/L (ref 39–117)
BUN: 19 mg/dL (ref 6–23)
CHLORIDE: 102 meq/L (ref 96–112)
CO2: 32 meq/L (ref 19–32)
CREATININE: 1 mg/dL (ref 0.40–1.50)
Calcium: 9.5 mg/dL (ref 8.4–10.5)
GFR: 78.3 mL/min (ref >60.00)
Glucose, Bld: 91 mg/dL (ref 70–99)
Potassium: 4.7 mEq/L (ref 3.5–5.1)
SODIUM: 140 meq/L (ref 135–145)
Total Bilirubin: 0.9 mg/dL (ref 0.2–1.2)
Total Protein: 6.5 g/dL (ref 6.0–8.3)

## 2016-09-26 LAB — LIPID PANEL
CHOLESTEROL: 224 mg/dL — AB (ref 0–200)
HDL: 63.2 mg/dL (ref >39.00)
LDL CALC: 147 mg/dL — AB (ref 0–99)
NONHDL: 161.13
Total CHOL/HDL Ratio: 4
Triglycerides: 71 mg/dL (ref 0.0–149.0)
VLDL: 14.2 mg/dL (ref 0.0–40.0)

## 2016-09-26 LAB — PSA: PSA: 1.75 ng/mL (ref 0.10–4.00)

## 2016-09-26 MED ORDER — LISINOPRIL 5 MG PO TABS: 5.0000 mg | ORAL_TABLET | Freq: Every day | ORAL | 3 refills | Status: DC

## 2016-09-26 NOTE — Assessment & Plan Note (Signed)
Restart low dose lisinopril. Follow up in 2 weeks. Check labs today. The patient indicates understanding of these issues and agrees with the plan.  Orders Placed This Encounter  Procedures  . Comprehensive metabolic panel  . Lipid panel  . PSA

## 2016-09-26 NOTE — Patient Instructions (Signed)
Great to see you.  We are restarting Lisinopril 5 mg daily.  Please come see me in 2 weeks.  We will call you with your lab results.

## 2016-09-26 NOTE — Progress Notes (Signed)
Pre visit review using our clinic review tool, if applicable. No additional management support is needed unless otherwise documented below in the visit note. 

## 2016-09-26 NOTE — Progress Notes (Signed)
Subjective:   Patient ID: Scott Brennan, male    DOB: 04-23-1946, 71 y.o.   MRN: [id number]  JIA MALVIN is a pleasant 71 y.o. year old male who presents to clinic today with [place] ([place] ) and Hypertension  on 09/26/2016  HPI:  I have not seen patient in almost 5 years.  At that time, we were treating him for HTN with Lisinopril 5 mg daily.  [facility] and BPs were 180.98 and 176/98. He was asymptomatic.    Came to see Leafy Ro RN and she triaged him. Checked his VS and they were 174/80, Pulse 56-60 Walks 5 miles daily without difficulty, has noticed some Bilateral shoulder soreness.  No Cp, SOB, Ha, dizziness or visual changes No s/s of edema to extremities  I was not in the office that day so she consulted Dr. Damita Dunnings who recommended that he watch his BP over the weekend and follow up with me here today.  Lab Results  Component Value Date   CREATININE 1.05 10/14/2010   Lab Results  Component Value Date   WBC 4.4 (L) 09/22/2008   HGB 12.5 (L) 09/22/2008   HCT 37.2 (L) 09/22/2008   MCV 90.8 09/22/2008   PLT 220 09/22/2008   Lab Results  Component Value Date   CHOL 218 (H) 10/14/2010   HDL 60 10/14/2010   LDLCALC 149 (H) 10/14/2010   LDLDIRECT 143.6 09/28/2009   TRIG 44 10/14/2010   CHOLHDL 3.6 Ratio 10/14/2010   No current outpatient prescriptions on file prior to visit.   No current facility-administered medications on file prior to visit.     Allergies  Allergen Reactions  . Iohexol     Past Medical History:  Diagnosis Date  . ED (erectile dysfunction)   . HTN (hypertension)     Past Surgical History:  Procedure Laterality Date  . [place] SURGERY  11/97  . PELVIC FRACTURE SURGERY  09/2003  . PILONIDAL CYST EXCISION      No family history on file.  Social History   Social History  . Marital status: Married    Spouse name: N/A  . Number of children: 2  . Years of education: N/A    Occupational History  .  [employer]   Social History Main Topics  . Smoking status: Former Smoker    Start date: 09/15/2003  . Smokeless tobacco: Never Used  . Alcohol use No  . Drug use: No  . Sexual activity: Not on file   Other Topics Concern  . Not on file   Social History Narrative   Married      2 children; 1 grandchild      [employer] (40 hr/week)         The PMH, PSH, Social History, Family History, Medications, and allergies have been reviewed in [facility], and have been updated if relevant.    Review of Systems  Constitutional: Negative.   HENT: Negative.   Eyes: Negative.   Respiratory: Negative.   Cardiovascular: Negative.   Gastrointestinal: Negative.   Endocrine: Negative.   Genitourinary: Negative.   Musculoskeletal: Negative.   Skin: Negative.   Allergic/Immunologic: Negative.   Neurological: Negative.   Hematological: Negative.   Psychiatric/Behavioral: Negative.   All other systems reviewed and are negative.      Objective:    BP (!) 146/88   Pulse (!) 54   Temp 97.6 F (36.4 C) (Oral)  Wt 175 lb 8 oz (79.6 kg)   SpO2 98%   BMI 25.00 kg/m    Physical Exam  General:  pleasant male in no acute distress Eyes:  PERRL Ears:  External ear exam shows no significant lesions or deformities.  TMs normal bilaterally Hearing is grossly normal bilaterally. Nose:  External nasal examination shows no deformity or inflammation. Nasal mucosa are pink and moist without lesions or exudates. Mouth:  Oral mucosa and oropharynx without lesions or exudates.  Teeth in good repair. Neck:  no carotid bruit or thyromegaly no cervical or supraclavicular lymphadenopathy  Lungs:  Normal respiratory effort, chest expands symmetrically. Lungs are clear to auscultation, no crackles or wheezes. Heart:  Normal rate and regular rhythm. S1 and S2 normal without gallop, murmur, click, rub or other extra sounds. Abdomen:  Bowel sounds positive,abdomen  soft and non-tender without masses, organomegaly or hernias noted. Pulses:  R and L posterior tibial pulses are full and equal bilaterally  Extremities:  no edema  Psych:  Good eye contact, not anxious or depressed appearing      Assessment & Plan:   Essential hypertension No Follow-up on file.

## 2016-09-27 ENCOUNTER — Encounter: Payer: Self-pay | Admitting: *Deleted

## 2016-09-27 ENCOUNTER — Emergency Department (HOSPITAL_COMMUNITY)
Admission: EM | Admit: 2016-09-27 | Discharge: 2016-09-28 | Disposition: A | Discharge status: Home / Self Care | Payer: Medicare Other | Attending: Emergency Medicine | Admitting: Emergency Medicine

## 2016-09-27 ENCOUNTER — Encounter (HOSPITAL_COMMUNITY): Payer: Self-pay | Admitting: Emergency Medicine

## 2016-09-27 DIAGNOSIS — R55 Syncope and collapse: Secondary | ICD-10-CM | POA: Diagnosis not present

## 2016-09-27 DIAGNOSIS — E869 Volume depletion, unspecified: Secondary | ICD-10-CM | POA: Diagnosis not present

## 2016-09-27 DIAGNOSIS — Z7982 Long term (current) use of aspirin: Secondary | ICD-10-CM | POA: Insufficient documentation

## 2016-09-27 DIAGNOSIS — Z87891 Personal history of nicotine dependence: Secondary | ICD-10-CM | POA: Insufficient documentation

## 2016-09-27 DIAGNOSIS — I10 Essential (primary) hypertension: Secondary | ICD-10-CM | POA: Diagnosis not present

## 2016-09-27 LAB — BASIC METABOLIC PANEL
ANION GAP: 9 (ref 5–15)
BUN: 22 mg/dL — ABNORMAL HIGH (ref 6–20)
CALCIUM: 9.3 mg/dL (ref 8.9–10.3)
CO2: 28 mmol/L (ref 22–32)
Chloride: 102 mmol/L (ref 101–111)
Creatinine, Ser: 1.19 mg/dL (ref 0.61–1.24)
Glucose, Bld: 104 mg/dL — ABNORMAL HIGH (ref 65–99)
Potassium: 4.3 mmol/L (ref 3.5–5.1)
SODIUM: 139 mmol/L (ref 135–145)

## 2016-09-27 LAB — I-STAT TROPONIN, ED: TROPONIN I, POC: 0 ng/mL (ref 0.00–0.08)

## 2016-09-27 LAB — CBG MONITORING, ED
GLUCOSE-CAPILLARY: 67 mg/dL (ref 65–99)
Glucose-Capillary: 123 mg/dL — ABNORMAL HIGH (ref 65–99)

## 2016-09-27 LAB — URINALYSIS, ROUTINE W REFLEX MICROSCOPIC
Bilirubin Urine: NEGATIVE
Glucose, UA: NEGATIVE mg/dL
Hgb urine dipstick: NEGATIVE
Ketones, ur: 5 mg/dL — AB
LEUKOCYTES UA: NEGATIVE
NITRITE: NEGATIVE
Protein, ur: NEGATIVE mg/dL
Specific Gravity, Urine: 1.021 (ref 1.005–1.030)
pH: 7 (ref 5.0–8.0)

## 2016-09-27 LAB — CBC
HCT: 37.9 % — ABNORMAL LOW (ref 39.0–52.0)
HEMOGLOBIN: 12.6 g/dL — AB (ref 13.0–17.0)
MCH: 29.6 pg (ref 26.0–34.0)
MCHC: 33.2 g/dL (ref 30.0–36.0)
MCV: 89.2 fL (ref 78.0–100.0)
Platelets: 211 10*3/uL (ref 150–400)
RBC: 4.25 MIL/uL (ref 4.22–5.81)
RDW: 13.7 % (ref 11.5–15.5)
WBC: 7.2 10*3/uL (ref 4.0–10.5)

## 2016-09-27 MED ORDER — SODIUM CHLORIDE 0.9 % IV BOLUS (SEPSIS)
500.0000 mL | Freq: Once | INTRAVENOUS | Status: AC
  Administered 2016-09-27: 500 mL via INTRAVENOUS

## 2016-09-27 NOTE — Discharge Instructions (Signed)
Drink plenty of fluids. Stop lisinopril. Recheck with your doctor asap.

## 2016-09-27 NOTE — ED Triage Notes (Signed)
Pt c/o syncopal episode this afternoon, after getting a cramp in right lower abd-- was leaning over sink, then woke up on floor-- hit head-- started on lisinopril 5mg  today-- saw dr on Tuesday-- [facility] went to house, pt chose to come in private vehicle--  Brought ekg from EMS.

## 2016-09-27 NOTE — ED Provider Notes (Signed)
[facility] DEPT Provider Note   CSN: [id number] Arrival date & time: 09/27/16  1733     History   Chief Complaint Chief Complaint  Patient presents with  . Loss of Consciousness    HPI Scott Brennan is a 71 y.o. male.  HPI 71 y.o male presents today with syncopal episode today.  He states was well worked in yard.  In house tonight with some right flank pain and walked into kitchen.  Syncopeal episode and fell back and struick head.  He felt nauseated, but did not vomit.  Patient with some right knee pain now; Patient seen by pmd after giving blood Friday.  Seen by Md due to elevated bp and started lisinopril with first dose today.  Past Medical History:  Diagnosis Date  . ED (erectile dysfunction)   . HTN (hypertension)     Patient Active Problem List   Diagnosis Date Noted  . OTHER NONTHROMBOCYTOPENIC PURPURAS 09/22/2008  . ERECTILE DYSFUNCTION 08/30/2007  . DIZZINESS 08/30/2007  . CELLULITIS/ABSCESS, FINGER NOS 12/10/2006  . Essential hypertension 12/06/2006    Past Surgical History:  Procedure Laterality Date  . [place] SURGERY  11/97  . PELVIC FRACTURE SURGERY  09/2003  . PILONIDAL CYST EXCISION         Home Medications    Prior to Admission medications   Medication Sig Start Date End Date Taking? Authorizing Provider  aspirin 81 MG chewable tablet Chew 324 mg by mouth once.   Yes Historical Provider, MD  lisinopril (PRINIVIL,ZESTRIL) 5 MG tablet Take 1 tablet (5 mg total) by mouth daily. 09/26/16  Yes Lucille Passy, MD    Family History No family history on file.  Social History Social History  Substance Use Topics  . Smoking status: Former Smoker    Start date: 09/15/2003  . Smokeless tobacco: Never Used  . Alcohol use No     Allergies   Iohexol   Review of Systems Review of Systems  All other systems reviewed and are negative.    Physical Exam Updated Vital Signs BP (!) 137/54   Pulse 61   Temp 98.1 F (36.7 C) (Oral)    Resp 14   Ht 5\' 10"  (1.778 m)   Wt 79.4 kg   SpO2 100%   BMI 25.11 kg/m   Physical Exam  Constitutional: He is oriented to person, place, and time. He appears well-developed and well-nourished.  HENT:  Head: Normocephalic and atraumatic.  Right Ear: External ear normal.  Left Ear: External ear normal.  Nose: Nose normal.  Mouth/Throat: Oropharynx is clear and moist.  Eyes: Conjunctivae and EOM are normal. Pupils are equal, round, and reactive to light.  Neck: Normal range of motion. Neck supple.  Cardiovascular: Normal rate, regular rhythm, normal heart sounds and intact distal pulses.   Pulmonary/Chest: Effort normal and breath sounds normal.  Abdominal: Soft. Bowel sounds are normal.  Musculoskeletal: Normal range of motion.  Neurological: He is alert and oriented to person, place, and time. He has normal reflexes.  Skin: Skin is warm and dry.  Psychiatric: He has a normal mood and affect. His behavior is normal. Judgment and thought content normal.  Nursing note and vitals reviewed.    ED Treatments / Results  Labs (all labs ordered are listed, but only abnormal results are displayed) Labs Reviewed  BASIC METABOLIC PANEL - Abnormal; Notable for the following:       Result Value   Glucose, Bld 104 (*)    BUN 22 (*)  All other components within normal limits  CBC - Abnormal; Notable for the following:    Hemoglobin 12.6 (*)    HCT 37.9 (*)    All other components within normal limits  URINALYSIS, ROUTINE W REFLEX MICROSCOPIC - Abnormal; Notable for the following:    Ketones, ur 5 (*)    All other components within normal limits  CBG MONITORING, ED  I-STAT TROPOININ, ED    EKG  EKG Interpretation  Date/Time:  Wednesday September 27 2016 17:43:10 EST Ventricular Rate:  64 PR Interval:  136 QRS Duration: 86 QT Interval:  384 QTC Calculation: 396 R Axis:   79 Text Interpretation:  Sinus rhythm with Premature atrial complexes Otherwise normal ECG Confirmed by  Arnell Slivinski MD, Andee Poles [id number]) on 09/27/2016 10:18:08 PM       Radiology No results found.  Procedures Procedures (including critical care time)  Medications Ordered in ED Medications - No data to display   Initial Impression / Assessment and Plan / ED Course  I have reviewed the triage vital signs and the nursing notes.  Pertinent labs & imaging results that were available during my care of the patient were reviewed by me and considered in my medical decision making (see chart for details).     This Is a 71 year old man who is ordinarily very healthy and has done his usual 5 mile walk today as well as walked in the yard who had a syncopal episode this evening. Today's for stated he was started on lisinopril for borderline hypertension. Somewhat orthostatic here and BUN and creatinine are elevated. He is being given a fluid bolus. We discussed holding the amlodipine and he will follow-up with his primary care as an outpatient.  Final Clinical Impressions(s) / ED Diagnoses   Final diagnoses:  Syncope, unspecified syncope type  Volume depletion    New Prescriptions New Prescriptions   No medications on file     Pattricia Boss, MD 09/27/16 2322

## 2016-09-28 ENCOUNTER — Telehealth: Payer: Self-pay

## 2016-09-28 NOTE — Telephone Encounter (Signed)
Pt left v/m; pt established care on 09/26/16; pt started lisinopril 5 mg on 09/27/16 and after taking lisinopril pt blacked out and was taken to ED. Pt checked out ok but pt was to f/u with Dr Deborra Medina. Pt has appt on 10/03/16 to see Dr Deborra Medina.

## 2016-09-28 NOTE — Telephone Encounter (Signed)
Spoke to pt and advised per Dr Deborra Medina. Pt will hold until seen at [facility]

## 2016-09-28 NOTE — Telephone Encounter (Signed)
Thanks for the update.  Please hold lisinopril until I see him.

## 2016-10-03 ENCOUNTER — Encounter: Payer: Self-pay | Admitting: Family Medicine

## 2016-10-03 ENCOUNTER — Ambulatory Visit (INDEPENDENT_AMBULATORY_CARE_PROVIDER_SITE_OTHER): Payer: Medicare Other | Admitting: Family Medicine

## 2016-10-03 VITALS — BP 128/66 | HR 89 | Temp 98.0°F | Wt 178.2 lb

## 2016-10-03 DIAGNOSIS — R42 Dizziness and giddiness: Secondary | ICD-10-CM | POA: Diagnosis not present

## 2016-10-03 DIAGNOSIS — I10 Essential (primary) hypertension: Secondary | ICD-10-CM

## 2016-10-03 NOTE — Assessment & Plan Note (Signed)
Normotensive without rx. Will not restart lisinopril. He plans on purchasing a BP cuff and updating me with his blood pressures.

## 2016-10-03 NOTE — Progress Notes (Signed)
Pre visit review using our clinic review tool, if applicable. No additional management support is needed unless otherwise documented below in the visit note. 

## 2016-10-03 NOTE — Progress Notes (Signed)
Subjective:   Patient ID: Scott Brennan, male    DOB: [birth year], 71 y.o.   MRN: [id number]  Scott Brennan is a pleasant 71 y.o. year old male who presents to clinic today with Hospitalization Follow-up  on 10/03/2016  HPI:  Was taken to ER via EMS on 09/27/16 after a syncopal episode.  Notes reviewed.  Restarted lisinopril 5 mg daily on 09/26/16. The following day, he was working in the yard and not drinking much water.  Came in side and passed out in the kitchen.  In ER, given fluids, placed on cardiac monitor.  Advised to hold lisinopril and follow up with me today.   Has not had any further dizziness of syncopal episodes.   Current Outpatient Prescriptions on File Prior to Visit  Medication Sig Dispense Refill  . aspirin 81 MG chewable tablet Chew 324 mg by mouth once.    Marland Kitchen lisinopril (PRINIVIL,ZESTRIL) 5 MG tablet Take 1 tablet (5 mg total) by mouth daily. (Patient not taking: Reported on 10/03/2016) 90 tablet 3   No current facility-administered medications on file prior to visit.     Allergies  Allergen Reactions  . Iohexol Other (See Comments)    Hallucinations and out of body experience    Past Medical History:  Diagnosis Date  . ED (erectile dysfunction)   . HTN (hypertension)     Past Surgical History:  Procedure Laterality Date  . [place] SURGERY  11/97  . PELVIC FRACTURE SURGERY  09/2003  . PILONIDAL CYST EXCISION      No family history on file.  Social History   Social History  . Marital status: Married    Spouse name: N/A  . Number of children: 2  . Years of education: N/A   Occupational History  .  [employer]   Social History Main Topics  . Smoking status: Former Smoker    Start date: 09/15/2003  . Smokeless tobacco: Never Used  . Alcohol use No  . Drug use: No  . Sexual activity: Not on file   Other Topics Concern  . 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   Review of Systems  Constitutional: Negative.   HENT: Negative.   Eyes: Negative.   Respiratory: Negative.   Cardiovascular: Negative.   Gastrointestinal: Negative.   Endocrine: Negative.   Genitourinary: Negative.   Musculoskeletal: Negative.   Skin: Negative.   Neurological: Negative.   Hematological: Negative.   Psychiatric/Behavioral: Negative.   All other systems reviewed and are negative.      Objective:    BP 128/66   Pulse 89   Temp 98 F (36.7 C) (Oral)   Wt 178 lb 4 oz (80.9 kg)   SpO2 95%   BMI 25.58 kg/m    Physical Exam  Constitutional: He is oriented to person, place, and time. He appears well-developed and well-nourished. No distress.  HENT:  Head: Normocephalic and atraumatic.  Eyes: Conjunctivae are normal.  Cardiovascular: Normal rate and regular rhythm.   Pulmonary/Chest: Effort normal and breath sounds normal.  Musculoskeletal: Normal range of motion. He exhibits no edema.  Neurological: He is alert and oriented to person, place, and time. No  cranial nerve deficit.  Skin: Skin is warm and dry. He is not diaphoretic.  Psychiatric: He has a normal mood and affect. His behavior is normal. Judgment and thought content normal.  Nursing note and vitals reviewed.         Assessment & Plan:   DIZZINESS  Essential hypertension No Follow-up on file.

## 2016-10-03 NOTE — Patient Instructions (Signed)
Great to see you.  Let's not restart your blood pressure medication.  OMRON is a great blood pressure cuff. Keep an eye on your blood pressure for me.

## 2016-10-03 NOTE — Assessment & Plan Note (Signed)
Resolved. Likely combination of dehydration and starting lisinopril- orthostasis.

## 2016-10-10 ENCOUNTER — Ambulatory Visit: Payer: Medicare Other | Admitting: Family Medicine

## 2016-10-25 ENCOUNTER — Telehealth: Payer: Self-pay | Admitting: *Deleted

## 2016-10-25 NOTE — Telephone Encounter (Signed)
PT brought in his readings from taking his BP. Please call him on his cell if you have any further questions. Paper placed on cart.

## 2016-10-26 NOTE — Telephone Encounter (Signed)
Readings reviewed- they look great.  Placed them in outbox to be scanned into chart.

## 2017-01-03 ENCOUNTER — Encounter: Payer: Self-pay | Admitting: Family Medicine

## 2017-01-03 ENCOUNTER — Ambulatory Visit (INDEPENDENT_AMBULATORY_CARE_PROVIDER_SITE_OTHER): Payer: Medicare Other | Admitting: Family Medicine

## 2017-01-03 VITALS — BP 152/78 | HR 70 | Temp 97.8°F | Wt 169.0 lb

## 2017-01-03 DIAGNOSIS — J309 Allergic rhinitis, unspecified: Secondary | ICD-10-CM | POA: Insufficient documentation

## 2017-01-03 DIAGNOSIS — J3089 Other allergic rhinitis: Secondary | ICD-10-CM | POA: Diagnosis not present

## 2017-01-03 DIAGNOSIS — Z1211 Encounter for screening for malignant neoplasm of colon: Secondary | ICD-10-CM

## 2017-01-03 MED ORDER — BETAMETHASONE DIPROPIONATE 0.05 % EX LOTN: TOPICAL_LOTION | Freq: Two times a day (BID) | CUTANEOUS | 0 refills | Status: DC

## 2017-01-03 NOTE — Progress Notes (Signed)
SUBJECTIVE:  Scott Brennan is a 71 y.o. male who complains of congestion intermittently for years.  His wife wanted him to get checked out because "he is always congested.". He denies a history of anorexia, chest pain, chills, dizziness, fevers, myalgias, nausea, shortness of breath and sputum production and denies a history of asthma. Patient denies smoke cigarettes.   Current Outpatient Prescriptions on File Prior to Visit  Medication Sig Dispense Refill  . aspirin 81 MG chewable tablet Chew 324 mg by mouth once.     No current facility-administered medications on file prior to visit.     Allergies  Allergen Reactions  . Iohexol Other (See Comments)    Hallucinations and out of body experience    Past Medical History:  Diagnosis Date  . ED (erectile dysfunction)   . HTN (hypertension)     Past Surgical History:  Procedure Laterality Date  . [place] SURGERY  11/97  . PELVIC FRACTURE SURGERY  09/2003  . PILONIDAL CYST EXCISION      No family history on file.  Social History   Social History  . Marital status: Married    Spouse name: N/A  . Number of children: 2  . Years of education: N/A   Occupational History  .  [employer]   Social History Main Topics  . Smoking status: Former Smoker    Start date: 09/15/2003  . Smokeless tobacco: Never Used  . Alcohol use No  . Drug use: No  . Sexual activity: Not on file   Other Topics Concern  . 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  OBJECTIVE: BP (!) 152/78   Pulse 70   Temp 97.8 F (36.6 C)   Wt 169 lb (76.7 kg)   SpO2 98%   BMI 24.25 kg/m   He appears well, vital signs are as noted. Ears normal.  Throat and pharynx normal.  Neck supple. No adenopathy in the neck. Nose  is congested. Sinuses non tender. The chest is clear, without wheezes or rales.  ASSESSMENT:  allergic rhinitis  PLAN: Symptomatic therapy suggested: push fluids, rest and return office visit prn if symptoms persist or worsen. Lack of antibiotic effectiveness discussed with him. Call or return to clinic prn if these symptoms worsen or fail to improve as anticipated.

## 2017-01-03 NOTE — Progress Notes (Signed)
Pre visit review using our clinic review tool, if applicable. No additional management support is needed unless otherwise documented below in the visit note. 

## 2017-01-04 ENCOUNTER — Encounter: Payer: Self-pay | Admitting: Gastroenterology

## 2017-02-20 ENCOUNTER — Encounter: Payer: Self-pay | Admitting: Gastroenterology

## 2017-02-20 ENCOUNTER — Ambulatory Visit: Payer: Medicare Other | Admitting: *Deleted

## 2017-02-20 VITALS — Ht 70.0 in | Wt 166.8 lb

## 2017-02-20 DIAGNOSIS — Z1211 Encounter for screening for malignant neoplasm of colon: Secondary | ICD-10-CM

## 2017-02-20 MED ORDER — SUPREP BOWEL PREP KIT 17.5-3.13-1.6 GM/177ML PO SOLN: ORAL | 0 refills | Status: DC

## 2017-02-20 NOTE — Progress Notes (Signed)
Patient denies any allergies to egg or soy products. Patient denies complications with anesthesia/sedation.  Patient denies oxygen use at home and denies diet medications. Emmi instructions for colonoscopy explained but denied.

## 2017-03-06 ENCOUNTER — Ambulatory Visit (AMBULATORY_SURGERY_CENTER): Payer: Medicare Other | Admitting: Gastroenterology

## 2017-03-06 ENCOUNTER — Encounter: Payer: Self-pay | Admitting: Gastroenterology

## 2017-03-06 VITALS — BP 116/59 | HR 49 | Temp 98.2°F | Resp 19 | Ht 70.0 in | Wt 166.0 lb

## 2017-03-06 DIAGNOSIS — Z1211 Encounter for screening for malignant neoplasm of colon: Secondary | ICD-10-CM

## 2017-03-06 DIAGNOSIS — Z86010 Personal history of colonic polyps: Secondary | ICD-10-CM | POA: Diagnosis not present

## 2017-03-06 DIAGNOSIS — Z1212 Encounter for screening for malignant neoplasm of rectum: Secondary | ICD-10-CM | POA: Diagnosis not present

## 2017-03-06 MED ORDER — SODIUM CHLORIDE 0.9 % IV SOLN: 500.0000 mL | INTRAVENOUS | Status: AC

## 2017-03-06 NOTE — Progress Notes (Signed)
Report given to PACU, vss 

## 2017-03-06 NOTE — Patient Instructions (Signed)
**  Handouts given on hemorrhoids**   YOU HAD AN ENDOSCOPIC PROCEDURE TODAY: Refer to the procedure report and other information in the discharge instructions given to you for any specific questions about what was found during the examination. If this information does not answer your questions, please call [facility] office at [phone] to clarify.   YOU SHOULD EXPECT: Some feelings of bloating in the abdomen. Passage of more gas than usual. Walking can help get rid of the air that was put into your GI tract during the procedure and reduce the bloating. If you had a lower endoscopy (such as a colonoscopy or flexible sigmoidoscopy) you may notice spotting of blood in your stool or on the toilet paper. Some abdominal soreness may be present for a day or two, also.  DIET: Your first meal following the procedure should be a light meal and then it is ok to progress to your normal diet. A half-sandwich or bowl of soup is an example of a good first meal. Heavy or fried foods are harder to digest and may make you feel nauseous or bloated. Drink plenty of fluids but you should avoid alcoholic beverages for 24 hours. If you had a esophageal dilation, please see attached instructions for diet.    ACTIVITY: Your care partner should take you home directly after the procedure. You should plan to take it easy, moving slowly for the rest of the day. You can resume normal activity the day after the procedure however YOU SHOULD NOT DRIVE, use power tools, machinery or perform tasks that involve climbing or major physical exertion for 24 hours (because of the sedation medicines used during the test).   SYMPTOMS TO REPORT IMMEDIATELY: A gastroenterologist can be reached at any hour. Please call [phone]  for any of the following symptoms:  Following lower endoscopy (colonoscopy, flexible sigmoidoscopy) Excessive amounts of blood in the stool  Significant tenderness, worsening of abdominal pains  Swelling of the  abdomen that is new, acute  Fever of 100 or higher    FOLLOW UP:  If any biopsies were taken you will be contacted by phone or by letter within the next 1-3 weeks. Call [phone]  if you have not heard about the biopsies in 3 weeks.  Please also call with any specific questions about appointments or follow up tests.

## 2017-03-06 NOTE — Op Note (Signed)
[facility] Patient Name: Scott Brennan Procedure Date: 03/06/2017 11:18 AM MRN: [id number] Endoscopist: Ladene Artist , MD Age: 71 Referring MD:  Date of Birth: 02-01-1946 Gender: Male Account #: [id number] Procedure:                Colonoscopy Indications:              Screening for colorectal malignant neoplasm Medicines:                Monitored Anesthesia Care Procedure:                Pre-Anesthesia Assessment:                           - Prior to the procedure, a History and Physical                            was performed, and patient medications and                            allergies were reviewed. The patient's tolerance of                            previous anesthesia was also reviewed. The risks                            and benefits of the procedure and the sedation                            options and risks were discussed with the patient.                            All questions were answered, and informed consent                            was obtained. Prior Anticoagulants: The patient has                            taken no previous anticoagulant or antiplatelet                            agents. ASA Grade Assessment: II - A patient with                            mild systemic disease. After reviewing the risks                            and benefits, the patient was deemed in                            satisfactory condition to undergo the procedure.                           After obtaining informed consent, the colonoscope  was passed under direct vision. Throughout the                            procedure, the patient's blood pressure, pulse, and                            oxygen saturations were monitored continuously. The                            Model PCF-[id number]) scope was introduced                            through the anus and advanced to the the cecum,                            identified by  appendiceal orifice and ileocecal                            valve. The ileocecal valve, appendiceal orifice,                            and rectum were photographed. The quality of the                            bowel preparation was good. The colonoscopy was                            performed without difficulty. The patient tolerated                            the procedure well. Scope In: 11:25:14 AM Scope Out: 11:37:29 AM Scope Withdrawal Time: 0 hours 9 minutes 17 seconds  Total Procedure Duration: 0 hours 12 minutes 15 seconds  Findings:                 The perianal and digital rectal examinations were                            normal.                           A few medium-mouthed diverticula were found in the                            left colon.                           Internal hemorrhoids were found during                            retroflexion. The hemorrhoids were small and Grade                            I (internal hemorrhoids that do not prolapse).  The exam was otherwise without abnormality on                            direct and retroflexion views. Complications:            No immediate complications. Estimated blood loss:                            None. Estimated Blood Loss:     Estimated blood loss: none. Impression:               - Internal hemorrhoids.                           - Mild left colon diverticulosis.                           - The examination was otherwise normal on direct                            and retroflexion views.                           - No specimens collected. Recommendation:           - Patient has a contact number available for                            emergencies. The signs and symptoms of potential                            delayed complications were discussed with the                            patient. Return to normal activities tomorrow.                            Written discharge instructions  were provided to the                            patient.                           - Resume previous diet.                           - Continue present medications.                           - No repeat colonoscopy due to age and the absence                            of colonic polyps. Ladene Artist, MD 03/06/2017 11:41:33 AM This report has been signed electronically.

## 2017-03-07 ENCOUNTER — Telehealth: Payer: Self-pay | Admitting: *Deleted

## 2017-03-07 NOTE — Telephone Encounter (Signed)
  Follow up Call-  Call back number 03/06/2017  Post procedure Call Back phone  # [phone]  Permission to leave phone message Yes  Some recent data might be hidden     Patient questions:  Do you have a fever, pain , or abdominal swelling? No. Pain Score  0 *  Have you tolerated food without any problems? Yes.    Have you been able to return to your normal activities? Yes.    Do you have any questions about your discharge instructions: Diet   No. Medications  No. Follow up visit  No.  Do you have questions or concerns about your Care? No.  Actions: * If pain score is 4 or above: No action needed, pain <4.

## 2017-12-11 ENCOUNTER — Emergency Department (HOSPITAL_COMMUNITY)
Admission: EM | Admit: 2017-12-11 | Discharge: 2017-12-11 | Disposition: A | Discharge status: Home / Self Care | Payer: Medicare Other | Attending: Emergency Medicine | Admitting: Emergency Medicine

## 2017-12-11 ENCOUNTER — Ambulatory Visit: Payer: Self-pay | Admitting: *Deleted

## 2017-12-11 ENCOUNTER — Encounter (HOSPITAL_COMMUNITY): Payer: Self-pay

## 2017-12-11 ENCOUNTER — Other Ambulatory Visit: Payer: Self-pay

## 2017-12-11 DIAGNOSIS — R42 Dizziness and giddiness: Secondary | ICD-10-CM | POA: Diagnosis not present

## 2017-12-11 DIAGNOSIS — R55 Syncope and collapse: Secondary | ICD-10-CM | POA: Diagnosis not present

## 2017-12-11 DIAGNOSIS — I10 Essential (primary) hypertension: Secondary | ICD-10-CM | POA: Insufficient documentation

## 2017-12-11 DIAGNOSIS — Z87891 Personal history of nicotine dependence: Secondary | ICD-10-CM | POA: Diagnosis not present

## 2017-12-11 DIAGNOSIS — R404 Transient alteration of awareness: Secondary | ICD-10-CM | POA: Diagnosis not present

## 2017-12-11 LAB — BASIC METABOLIC PANEL
Anion gap: 7 (ref 5–15)
BUN: 22 mg/dL — ABNORMAL HIGH (ref 6–20)
CO2: 30 mmol/L (ref 22–32)
Calcium: 9.6 mg/dL (ref 8.9–10.3)
Chloride: 102 mmol/L (ref 101–111)
Creatinine, Ser: 1.16 mg/dL (ref 0.61–1.24)
GFR calc Af Amer: >60 mL/min (ref >60)
GFR calc non Af Amer: >60 mL/min (ref >60)
Glucose, Bld: 101 mg/dL — ABNORMAL HIGH (ref 65–99)
Potassium: 4.4 mmol/L (ref 3.5–5.1)
Sodium: 139 mmol/L (ref 135–145)

## 2017-12-11 LAB — CBC
HCT: 39.6 % (ref 39.0–52.0)
Hemoglobin: 13.3 g/dL (ref 13.0–17.0)
MCH: 30.2 pg (ref 26.0–34.0)
MCHC: 33.6 g/dL (ref 30.0–36.0)
MCV: 89.8 fL (ref 78.0–100.0)
Platelets: 211 10*3/uL (ref 150–400)
RBC: 4.41 MIL/uL (ref 4.22–5.81)
RDW: 13.7 % (ref 11.5–15.5)
WBC: 8.1 10*3/uL (ref 4.0–10.5)

## 2017-12-11 LAB — URINALYSIS, ROUTINE W REFLEX MICROSCOPIC
Bacteria, UA: NONE SEEN
Bilirubin Urine: NEGATIVE
Glucose, UA: NEGATIVE mg/dL
Ketones, ur: 5 mg/dL — AB
Leukocytes, UA: NEGATIVE
Nitrite: NEGATIVE
Protein, ur: NEGATIVE mg/dL
Specific Gravity, Urine: 1.013 (ref 1.005–1.030)
pH: 5 (ref 5.0–8.0)

## 2017-12-11 LAB — CBG MONITORING, ED: Glucose-Capillary: 76 mg/dL (ref 65–99)

## 2017-12-11 MED ORDER — SODIUM CHLORIDE 0.9 % IV BOLUS
1000.0000 mL | Freq: Once | INTRAVENOUS | Status: AC
  Administered 2017-12-11: 1000 mL via INTRAVENOUS

## 2017-12-11 NOTE — ED Triage Notes (Signed)
Pt presents for evaluation of abnormal EKG and near syncope. Pt reports has had this happen before in February. States he was working outside today, got hot, felt cramp in side and then started feeling dizzy and lightheaded.

## 2017-12-11 NOTE — ED Notes (Signed)
Urine culture save tube sent down with urine.

## 2017-12-11 NOTE — Telephone Encounter (Signed)
Pt states he was working outside for about 2 hours and began to feel lightheaded. Pt states when he began to feel lightheaded he sat down in a chair. Pt denies fainting or LOC but states that his wife called EMS at approximately 12:39 pm after the pt was not responding to her for about 30 min and states he was leaning to the right on a table. EMS was called to the pt's home and the pt's BP was 110/60 and HR-60, but was irregular. Pt states he does not have a history of an irregular HB, but was told he had a heart murmur at the age of 38. Pt states a similar event happened last year and he was seen in the ED and  evaluated by a cardiologist and was told he was dehydrated. Pt states EMS told him he needed to contact his PCP. Pt advised that he still needed to seek treatment in the ED for current symptoms. Pt verbalized understanding.  Reason for Disposition . Extra heart beats OR irregular heart beating  (i.e., "palpitations")  Answer Assessment - Initial Assessment Questions 1. DESCRIPTION: "Describe your dizziness."     I was feeling lightheaded  2. LIGHTHEADED: "Do you feel lightheaded?" (e.g., somewhat faint, woozy, weak upon standing)     Lightheaded while working outside 3. VERTIGO: "Do you feel like either you or the room is spinning or tilting?" (i.e. vertigo)     Not assessed 4. SEVERITY: "How bad is it?"  "Do you feel like you are going to faint?" "Can you stand and walk?"   - MILD - walking normally   - MODERATE - interferes with normal activities (e.g., work, school)    - SEVERE - unable to stand, requires support to walk, feels like passing out now.      Severe when outside 5. ONSET:  "When did the dizziness begin?"     EMS was called out to the house at approximately 1239pm 6. AGGRAVATING FACTORS: "Does anything make it worse?" (e.g., standing, change in head position)     standing 7. HEART RATE: "Can you tell me your heart rate?" "How many beats in 15 seconds?"  (Note: not all  patients can do this)       Per EMS HR 60 and irregular 8. CAUSE: "What do you think is causing the dizziness?"     Was outside for 2 hours 9. RECURRENT SYMPTOM: "Have you had dizziness before?" If so, ask: "When was the last time?" "What happened that time?"     Yes approximately a year ago 10. OTHER SYMPTOMS: "Do you have any other symptoms?" (e.g., fever, chest pain, vomiting, diarrhea, bleeding)       No other symptoms voiced at this time  Protocols used: DIZZINESS [facility]

## 2017-12-11 NOTE — Telephone Encounter (Signed)
It looks like he is currently in the ER.  Please call to check on patient tomorrow.

## 2017-12-11 NOTE — ED Provider Notes (Signed)
Patient placed in Quick Look pathway, seen and evaluated   Chief Complaint: Near Syncope  HPI:   72 y.o. male who presents for evaluation of near syncopal episode.  Patient reports that he had been out in the sun for approximately 2-1/2 hours when he started feeling lightheaded.  Patient states that he had to sit down in the corner to catch himself.  Wife found him several minutes later and states that he was complaining of lightheadedness, dizziness.  He denies any preceding chest pain.  Patient reports that he has had similar symptoms have been doing before when he is got dehydrated and hypoglycemic.  Patient reports he had not been drinking any water while working outside.  On ED arrival, patient states he is back to baseline he just feels slightly tired.  Patient denies any fevers, vision changes, numbness/weakness of his extremities, chest pain, abdominal pain, vomiting.  ROS: Near Syncope   Physical Exam:   Gen: No distress  Neuro: Awake and Alert  Skin: Warm  Neuro:  Cranial nerves III-XII intact  Follows commands, Moves all extremities   5/5 strength to BUE and BLE   Sensation intact throughout all major nerve distributions  Normal finger to nose.  No dysdiadochokinesia.  No pronator drift.  No gait abnormalities   No slurred speech. No facial droop.     Focused Exam: No nystagmus noted.  Lungs clear to auscultation bilaterally.  Abdomen is soft, non-distended, non-tender. No rigidity, No guarding. No peritoneal signs.   Initiation of care has begun. The patient has been counseled on the process, plan, and necessity for staying for the completion/evaluation, and the remainder of the medical screening examination    Volanda Napoleon, PA-C 12/11/17 [place], Ankit, MD 12/11/17 [id number]

## 2017-12-11 NOTE — Telephone Encounter (Signed)
TA-Do you want me to get this pt in for an OV and me do an EKG? Unsure what you would like to do/plz advise/thx dmf

## 2017-12-11 NOTE — ED Notes (Signed)
ED Provider at bedside. 

## 2017-12-12 NOTE — Telephone Encounter (Signed)
TA-He said he's doing ok now/they gave him fluids/he does not feel like he needs an appt/they checked his heart and it was ok/he said he will call and sched if he needs anything/thx dmf
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
## 2018-06-20 ENCOUNTER — Encounter: Payer: Self-pay | Admitting: Family Medicine

## 2018-08-19 ENCOUNTER — Ambulatory Visit (INDEPENDENT_AMBULATORY_CARE_PROVIDER_SITE_OTHER): Payer: Medicare Other | Admitting: Family Medicine

## 2018-08-19 ENCOUNTER — Encounter: Payer: Self-pay | Admitting: Family Medicine

## 2018-08-19 VITALS — BP 188/72 | HR 44 | Temp 97.7°F | Ht 70.0 in | Wt 167.0 lb

## 2018-08-19 DIAGNOSIS — R42 Dizziness and giddiness: Secondary | ICD-10-CM

## 2018-08-19 DIAGNOSIS — I10 Essential (primary) hypertension: Secondary | ICD-10-CM | POA: Diagnosis not present

## 2018-08-19 DIAGNOSIS — Z23 Encounter for immunization: Secondary | ICD-10-CM

## 2018-08-19 NOTE — Assessment & Plan Note (Signed)
Reported stable BP at home 140s/70s, though today elevated. Considered started antihypertensive medication, however, last syncopal event was within days of restarting medication. Suspect syncope related to dehydration but without changes in drinking habits not sure if he will tolerate medication. Discussed referral to cardiology for advice.

## 2018-08-19 NOTE — Progress Notes (Signed)
Subjective:     Scott Brennan is a 73 y.o. male presenting for Transfer of Care (previous PCP from Dr Deborra Medina.) and Immunizations (needs Prevnar 13 and Tetanus updated. )     HPI  #HTN - BP is elevated when he gives blood - heart rate is low - can be as low as 50 - BP 149/67 - was checking regularly -- typically 140/70s - was on lisinopril w/o significant change with stopping - hx of ER visit with dehydration and syncope/presyncope  - not a big drinker, both of the syncopal episodes - drinks 1/2 to 1 bottle of water daily  Avoids drinking b/c of nocturia   Review of Systems  Respiratory: Negative for shortness of breath.   Cardiovascular: Negative for chest pain, palpitations and leg swelling.  Neurological: Positive for light-headedness (occasionally with walking). Negative for dizziness.     Social History   Tobacco Use  Smoking Status Former Smoker  . Packs/day: 1.00  . Years: 37.00  . Pack years: 37.00  . Types: Cigarettes  . Last attempt to quit: 09/15/2003  . Years since quitting: 14.9  Smokeless Tobacco Never Used        Objective:    BP Readings from Last 3 Encounters:  08/19/18 (!) 188/72  12/11/17 137/60  03/06/17 (!) 116/59   Wt Readings from Last 3 Encounters:  08/19/18 167 lb (75.8 kg)  03/06/17 166 lb (75.3 kg)  02/20/17 166 lb 12.8 oz (75.7 kg)    BP (!) 188/72   Pulse (!) 44   Temp 97.7 F (36.5 C)   Ht 5\' 10"  (1.778 m)   Wt 167 lb (75.8 kg)   SpO2 100%   BMI 23.96 kg/m    Physical Exam Constitutional:      Appearance: Normal appearance. He is not ill-appearing or diaphoretic.  HENT:     Right Ear: External ear normal.     Left Ear: External ear normal.     Nose: Nose normal.  Eyes:     General: No scleral icterus.    Extraocular Movements: Extraocular movements intact.     Conjunctiva/sclera: Conjunctivae normal.  Neck:     Musculoskeletal: Neck supple.  Cardiovascular:     Rate and Rhythm: Normal rate and regular  rhythm.     Heart sounds: No murmur.  Pulmonary:     Effort: Pulmonary effort is normal.     Breath sounds: Normal breath sounds.  Musculoskeletal:     Right lower leg: No edema.     Left lower leg: No edema.  Skin:    General: Skin is warm and dry.  Neurological:     Mental Status: He is alert. Mental status is at baseline.  Psychiatric:        Mood and Affect: Mood normal.           Assessment & Plan:   Problem List Items Addressed This Visit      Cardiovascular and Mediastinum   Essential hypertension - Primary    Reported stable BP at home 140s/70s, though today elevated. Considered started antihypertensive medication, however, last syncopal event was within days of restarting medication. Suspect syncope related to dehydration but without changes in drinking habits not sure if he will tolerate medication. Discussed referral to cardiology for advice.       Relevant Orders   Ambulatory referral to Cardiology     Other   DIZZINESS   Relevant Orders   Ambulatory referral to Cardiology  Return in about 3 months (around 11/18/2018).  Lesleigh Noe, MD

## 2018-08-19 NOTE — Patient Instructions (Signed)
It was great to meet you.   I'm worried about your blood pressure and think that Cardiology will be helpful.

## 2018-09-09 DIAGNOSIS — H353131 Nonexudative age-related macular degeneration, bilateral, early dry stage: Secondary | ICD-10-CM | POA: Diagnosis not present

## 2018-09-09 DIAGNOSIS — H524 Presbyopia: Secondary | ICD-10-CM | POA: Diagnosis not present

## 2018-09-09 DIAGNOSIS — H5203 Hypermetropia, bilateral: Secondary | ICD-10-CM | POA: Diagnosis not present

## 2018-09-09 DIAGNOSIS — H52223 Regular astigmatism, bilateral: Secondary | ICD-10-CM | POA: Diagnosis not present

## 2018-09-09 DIAGNOSIS — H2513 Age-related nuclear cataract, bilateral: Secondary | ICD-10-CM | POA: Diagnosis not present

## 2018-09-30 ENCOUNTER — Ambulatory Visit: Payer: Medicare Other | Admitting: Cardiovascular Disease

## 2018-09-30 ENCOUNTER — Encounter: Payer: Self-pay | Admitting: Cardiovascular Disease

## 2018-09-30 VITALS — BP 160/80 | HR 58 | Ht 70.0 in | Wt 169.5 lb

## 2018-09-30 DIAGNOSIS — R55 Syncope and collapse: Secondary | ICD-10-CM | POA: Diagnosis not present

## 2018-09-30 DIAGNOSIS — I10 Essential (primary) hypertension: Secondary | ICD-10-CM

## 2018-09-30 DIAGNOSIS — E782 Mixed hyperlipidemia: Secondary | ICD-10-CM | POA: Diagnosis not present

## 2018-09-30 DIAGNOSIS — I700 Atherosclerosis of aorta: Secondary | ICD-10-CM | POA: Diagnosis not present

## 2018-09-30 DIAGNOSIS — Z87891 Personal history of nicotine dependence: Secondary | ICD-10-CM

## 2018-09-30 NOTE — Patient Instructions (Signed)
Measure pressures at home Call the numbers, the range   Medication Instructions:  No changes  If you need a refill on your cardiac medications before your next appointment, please call your pharmacy.    Lab work: No new labs needed   If you have labs (blood work) drawn today and your tests are completely normal, you will receive your results only by: Marland Kitchen MyChart Message (if you have MyChart) OR . A paper copy in the mail If you have any lab test that is abnormal or we need to change your treatment, we will call you to review the results.   Testing/Procedures: No new testing needed   Follow-Up: At [facility], you and your health needs are our priority.  As part of our continuing mission to provide you with exceptional heart care, we have created designated Provider Care Teams.  These Care Teams include your primary Cardiologist (physician) and Advanced Practice Providers (APPs -  Physician Assistants and Nurse Practitioners) who all work together to provide you with the care you need, when you need it.  . You will need a follow up appointment as needed  . Providers on your designated Care Team:   . Murray Hodgkins, NP . Christell Faith, PA-C . Marrianne Mood, PA-C  Any Other Special Instructions Will Be Listed Below (If Applicable).  For educational health videos Log in to : [email or web address] Or : [email or web address], password : [email or web address]

## 2018-09-30 NOTE — Progress Notes (Signed)
Cardiology Office Note  Date:  09/30/2018   ID:  Scott Brennan, DOB: 09/11/1945, MRN: [id number]  PCP:  Lesleigh Noe, MD   Chief Complaint  Patient presents with  . OTHER    Hypertension, dizziness and near syncope. Meds reviewed verbally with pt.    HPI:  [name] is a 73 y.o. male with PMHx of: HTN Past smoker, for more than 30 years referred by Dr. Waunita Schooner for HTN, dizziness  INTERVAL HISTORY: The patient reports today for an initial consultation.  He reports two episodes of syncope, both after working bent over for an extended period of time. Also adding he hadn't drank a lot of water  1 recent episode was after starting lisinopril for the first time, working in the heat painting, stood up and had near syncope followed by syncope symptoms  1 prior episode with similar circumstances, can with head down for several hours then stood up and had near syncope followed by syncope witnessed by his wife  Previously on lisinopril, this was held at it did not seem to make much of a difference per the patient  He reports an occasional cramp to the left side of his neck, which is not unbearable.   Of note, his wife has developed Alzheimer's adding stress to patient.   Today's Blood pressure 206/83 left arm, some stress coming in today 186/114 right arm  BP rechecked in the room by me: 160/80, was more calm  Orthostatics negative  HR BP SYMPTOMS  Lying 54  194/77   Sitting 55  190/89   Standing (13min) 63 185/99   Standing (48min) 58 182/83    Lab work reviewed with him in detail Total chol 224/ LDL 147 (09/26/2016) CR 1.16 Glucose 101  CT scan from 2005 pulled up showing very mild distal aortic atherosclerosis  EKG personally reviewed by myself on todays visit Shows sinus bradycardia with PAC'S rhythm. 59 bpm.    OTHER PAST MEDICAL HISTORY REVIEWED BY ME FOR TODAY'S VISIT: 11/2017 ER visit: "near syncope. Patient reports working out in the hot weather for  approximately 2 hours while sealing his deck. He was just finishing up when he went to stand up and began to feel lightheaded. He reports that he really did not eat or drink anything today."  PMH:   has a past medical history of Arthritis, Asthma, Blood transfusion without reported diagnosis (2005), ED (erectile dysfunction), Heart murmur, and HTN (hypertension).  PSH:    Past Surgical History:  Procedure Laterality Date  . [facility] SURGERY  11/97  . COLONOSCOPY    . FOOT SURGERY Right    big toe  . PELVIC FRACTURE SURGERY  09/2003  . PILONIDAL CYST EXCISION    . TONSILLECTOMY    . widom teeth ext      Current Outpatient Medications  Medication Sig Dispense Refill  . diphenhydrAMINE (BENADRYL) 50 MG capsule Take 50 mg by mouth every 6 (six) hours as needed for allergies.    . vitamin B-12 (CYANOCOBALAMIN) 1000 MCG tablet Take 1,000 mcg by mouth daily.     No current facility-administered medications for this visit.     ALLERGIES:   Iohexol   SOCIAL HISTORY:  The patient  reports that he quit smoking about 15 years ago. His smoking use included cigarettes. He has a 37.00 pack-year smoking history. He has never used smokeless tobacco. He reports that he does not drink alcohol or use drugs.   FAMILY HISTORY:  family history includes Diabetes in his maternal grandmother and mother; Heart attack (age of onset: 73) in his mother; Heart disease in his mother; Hypertension in his sister; Memory loss in his father.    REVIEW OF SYSTEMS: Review of Systems  Constitutional: Negative.   Eyes: Negative.   Respiratory: Negative.  Negative for shortness of breath.   Cardiovascular: Negative.  Negative for chest pain.  Gastrointestinal: Negative.   Genitourinary: Negative.   Musculoskeletal: Negative.   Neurological: Positive for dizziness and loss of consciousness.  Psychiatric/Behavioral: Negative.   All other systems reviewed and are negative.   PHYSICAL EXAM: VS:  BP (!) 160/80  (BP Location: Left Arm, Patient Position: Sitting)   Pulse (!) 58   Ht 5\' 10"  (1.778 m)   Wt 169 lb 8 oz (76.9 kg)   BMI 24.32 kg/m  , BMI Body mass index is 24.32 kg/m.  Constitutional:  oriented to person, place, and time. No distress.  HENT: normal Head: Grossly normal Eyes:  no discharge. No scleral icterus.  Neck: No JVD, no carotid bruits  Cardiovascular: Regular rate and rhythm, no murmurs appreciated, normal pulses Pulmonary/Chest: Clear to auscultation bilaterally, no wheezes or rails Abdominal: Soft.  no distension.  no tenderness.  Musculoskeletal: Normal range of motion Neurological:  normal muscle tone. Coordination normal. No atrophy Skin: Skin warm and dry Psychiatric: normal affect, pleasant   RECENT LABS: 12/11/2017: BUN 22; Creatinine, Ser 1.16; Hemoglobin 13.3; Platelets 211; Potassium 4.4; Sodium 139    LIPID PANEL: Lab Results  Component Value Date   CHOL 224 (H) 09/26/2016   HDL 63.20 09/26/2016   LDLCALC 147 (H) 09/26/2016   TRIG 71.0 09/26/2016      WEIGHT: Wt Readings from Last 3 Encounters:  09/30/18 169 lb 8 oz (76.9 kg)  08/19/18 167 lb (75.8 kg)  03/06/17 166 lb (75.3 kg)      ASSESSMENT AND PLAN:  Syncope Plan: EKG 12-Lead Suspect related to orthostasis after standing up from crouched position for long period of time Recommended he stay hydrated, come up slowly from a sitting position Less likely cardiac arrhythmia Essentially normal EKG and clinical exam  Aortic atherosclerosis  mild, seen on CT scan distal aorta He does not want a statin  Hyperlipidemia Total cholesterol typically 220 up to 230 He does not want a statin  HTN Plan: EKG 12-Lead Markedly elevated on arrival, suspect stressors from today, traveling with his wife who has Alzheimer's (she is in the waiting room), and stress of coming into the office today Blood pressure measurements at home much more reasonable 120 up to 140s per his assessment We did not start  any medications at this time Blood pressure started to trend downward at the end of our visit Recommend patient call with numbers.  Recommended goal range systolic 283 up to 151  Disposition:   F/U  PRN   Total encounter time more than 60 minutes. Greater than 50% was spent in counseling and coordination of care with the patient.   Orders Placed This Encounter  Procedures  . EKG 12-Lead    I, Margit Banda am acting as a scribe for Ida Rogue, M.D., Ph.D.  I have reviewed the above documentation for accuracy and completeness, and I agree with the above.   Signed, Esmond Plants, M.D., Ph.D. 09/30/2018  [place], [place]

## 2018-10-01 ENCOUNTER — Ambulatory Visit: Payer: Medicare Other | Admitting: Cardiovascular Disease

## 2018-10-10 ENCOUNTER — Telehealth: Payer: Self-pay | Admitting: Cardiovascular Disease

## 2018-10-10 NOTE — Telephone Encounter (Signed)
Spoke with patient and he denies any symptoms but reports that those blood pressure readings were done without rest. He is not on any medications at this time. Advised that I would send over to provider for his review and would be in touch with any other recommendations. He was appreciative for the call back with no further questions at this time.

## 2018-10-10 NOTE — Telephone Encounter (Signed)
List of blood pressure readings dropped off and ranged from 116/63 to 167/79. Average was 150's over 70's. Left voicemail message for patient to call back for review of these readings.   2/18 07:30AM 135/79 2/18 3:14 PM 134/70 53 2/19 07:30AM 152/73 49 2/19 3:03 PM 155/74 37 2/20 07:20 AM 139/74 51 2/20 4:30PM 159/86 56 2/21 07:30 AM 157/81 42 2/21 07:50 AM 142/71 53 2/21 3:56 PM 151/83 53 2/22 07:00 AM 135/78 60 2/22 4:30PM 146/81 59 2/23 08:30AM 142/73 57 2/23 08:40PM 153/79 54 2/24 08:00 AM 116/63 54 2/24 4:00PM 130/67 50 2/25 09:00AM 163/75 42 130/67 50 2/25 4:15 pm 167/79 45 2/26 11:45 am 143/78 51 2/26 11:45am 143/78 51 2/26 6:15pm 137/70 53

## 2018-10-10 NOTE — Telephone Encounter (Signed)
Patient dropped off a list of blood pressure readings to be reviewed Placed in nurse box

## 2018-10-13 NOTE — Telephone Encounter (Signed)
Would start losartan 100 mg daily He will need to stay hydrated to avoid BP dropouts when he is in the hot sun

## 2018-10-14 NOTE — Telephone Encounter (Signed)
Called patient. He said he's continued to monitor BP and its decreased. Pam had told him last week to sit for 10 minutes prior to taking. The previous readings were taken at random when he was doing things and not after sitting for 5-10 minutes before taking the reading. Since 2/27, patient has been sitting for 10 minutes then taking reading. Readings are much lower ranging from 123-127 over 60-mid-70's. Highest systolic reading was 404. Patient would like to hold off on BP med if that's still ok with Dr Rockey Situ. Routing to Dr Rockey Situ for review.

## 2018-10-15 NOTE — Telephone Encounter (Signed)
Fine to hold We can add medication 1/2 up to whole pill if pressures trend upward

## 2018-10-16 NOTE — Telephone Encounter (Signed)
Spoke with patient.  He shared recent BP Readings: 2/27 141/66,  55 2/28 AM 130/73,  49  PM 121/61,  56 2/29 AM 129/61,  50  PM 133/66,  51 3/1 AM 122/61,  53  PM 127/74,  56 3/2 AM 125/56,  47  PM 122/63,  55 3/3 AM 127/62,  53  PM 143/80,  46  As patient continues to monitor BP, he will call us back if it is trending upward and the need arise for BP med.

## 2018-11-08 ENCOUNTER — Ambulatory Visit (INDEPENDENT_AMBULATORY_CARE_PROVIDER_SITE_OTHER): Payer: Medicare Other | Admitting: Family Medicine

## 2018-11-08 ENCOUNTER — Ambulatory Visit (INDEPENDENT_AMBULATORY_CARE_PROVIDER_SITE_OTHER)
Admission: RE | Admit: 2018-11-08 | Discharge: 2018-11-08 | Disposition: A | Discharge status: Home / Self Care | Payer: Medicare Other | Source: Ambulatory Visit | Attending: Family Medicine | Admitting: Family Medicine

## 2018-11-08 ENCOUNTER — Encounter: Payer: Self-pay | Admitting: Family Medicine

## 2018-11-08 ENCOUNTER — Other Ambulatory Visit: Payer: Self-pay

## 2018-11-08 VITALS — BP 158/84 | HR 48 | Temp 97.7°F | Ht 70.0 in | Wt 168.4 lb

## 2018-11-08 DIAGNOSIS — W19XXXA Unspecified fall, initial encounter: Secondary | ICD-10-CM

## 2018-11-08 DIAGNOSIS — M79642 Pain in left hand: Secondary | ICD-10-CM

## 2018-11-08 DIAGNOSIS — S40811A Abrasion of right upper arm, initial encounter: Secondary | ICD-10-CM

## 2018-11-08 DIAGNOSIS — S62115A Nondisplaced fracture of triquetrum [cuneiform] bone, left wrist, initial encounter for closed fracture: Secondary | ICD-10-CM

## 2018-11-08 DIAGNOSIS — S62113A Displaced fracture of triquetrum [cuneiform] bone, unspecified wrist, initial encounter for closed fracture: Secondary | ICD-10-CM | POA: Insufficient documentation

## 2018-11-08 DIAGNOSIS — S62112A Displaced fracture of triquetrum [cuneiform] bone, left wrist, initial encounter for closed fracture: Secondary | ICD-10-CM | POA: Diagnosis not present

## 2018-11-08 MED ORDER — MELOXICAM 15 MG PO TABS: 15.0000 mg | ORAL_TABLET | Freq: Every day | ORAL | 1 refills | Status: DC

## 2018-11-08 NOTE — Patient Instructions (Addendum)
Don't use peroxide on wounds   meloxicam - take with food  Wear the wrist splint We will set you up with sport med next week   Update if suddenly worse or more painful

## 2018-11-08 NOTE — Assessment & Plan Note (Signed)
On concrete on outstretched hand  Evaluating wrist/hand injury  Abrasions look ok  In general-no balance issues Disc fall prev

## 2018-11-08 NOTE — Assessment & Plan Note (Signed)
Small abrasions in L thenar area and upper arm (1 cm or less) Clean and not bleeding Disc care with soap and water/cover/abx oint Watch for redness or drainage- call  Update if not starting to improve in a week or if worsening  utd tetanus shot
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
## 2018-11-10 NOTE — Assessment & Plan Note (Signed)
With swelling and pain of wrist and limited rom due to pain  From fall on outstretched hand several days ago  Xray reviewed- no sisplacement Given wrist/forearm splint to start wearing Recommend ice/elevation and relative rest meloxicam px for swelling and discomfort Schedule a f/u appt with sport medicine next week

## 2018-11-11 ENCOUNTER — Other Ambulatory Visit: Payer: Self-pay

## 2018-11-11 ENCOUNTER — Ambulatory Visit (INDEPENDENT_AMBULATORY_CARE_PROVIDER_SITE_OTHER): Payer: Medicare Other | Admitting: Family Medicine

## 2018-11-11 ENCOUNTER — Encounter: Payer: Self-pay | Admitting: Family Medicine

## 2018-11-11 VITALS — BP 150/90 | HR 57 | Temp 97.6°F | Ht 70.0 in | Wt 167.8 lb

## 2018-11-11 DIAGNOSIS — M25532 Pain in left wrist: Secondary | ICD-10-CM | POA: Diagnosis not present

## 2018-11-11 DIAGNOSIS — S62115A Nondisplaced fracture of triquetrum [cuneiform] bone, left wrist, initial encounter for closed fracture: Secondary | ICD-10-CM | POA: Diagnosis not present

## 2018-11-11 NOTE — Progress Notes (Signed)
Thanks for seeing him! 

## 2018-11-18 ENCOUNTER — Ambulatory Visit: Payer: Medicare Other | Admitting: Family Medicine

## 2018-11-20 ENCOUNTER — Telehealth: Payer: Self-pay | Admitting: Family Medicine

## 2018-11-20 NOTE — Telephone Encounter (Signed)
Called to see if patient has video capability so 4/13 appt can be virtual. No answer and no voicemail.

## 2018-11-25 ENCOUNTER — Ambulatory Visit: Payer: Medicare Other | Admitting: Family Medicine

## 2018-11-27 ENCOUNTER — Ambulatory Visit (INDEPENDENT_AMBULATORY_CARE_PROVIDER_SITE_OTHER)
Admission: RE | Admit: 2018-11-27 | Discharge: 2018-11-27 | Disposition: A | Discharge status: Home / Self Care | Payer: Medicare Other | Source: Ambulatory Visit | Attending: Family Medicine | Admitting: Family Medicine

## 2018-11-27 ENCOUNTER — Ambulatory Visit (INDEPENDENT_AMBULATORY_CARE_PROVIDER_SITE_OTHER): Payer: Medicare Other | Admitting: Family Medicine

## 2018-11-27 ENCOUNTER — Other Ambulatory Visit: Payer: Self-pay

## 2018-11-27 ENCOUNTER — Encounter: Payer: Self-pay | Admitting: Obstetrics and Gynecology

## 2018-11-27 ENCOUNTER — Encounter: Payer: Self-pay | Admitting: Family Medicine

## 2018-11-27 VITALS — BP 186/86 | HR 61 | Temp 97.8°F | Ht 70.0 in | Wt 168.8 lb

## 2018-11-27 DIAGNOSIS — S62115A Nondisplaced fracture of triquetrum [cuneiform] bone, left wrist, initial encounter for closed fracture: Secondary | ICD-10-CM

## 2018-11-27 DIAGNOSIS — H5789 Other specified disorders of eye and adnexa: Secondary | ICD-10-CM | POA: Diagnosis not present

## 2018-11-27 DIAGNOSIS — S62112A Displaced fracture of triquetrum [cuneiform] bone, left wrist, initial encounter for closed fracture: Secondary | ICD-10-CM | POA: Diagnosis not present

## 2018-11-27 DIAGNOSIS — H1131 Conjunctival hemorrhage, right eye: Secondary | ICD-10-CM | POA: Diagnosis not present

## 2018-11-27 DIAGNOSIS — S0501XA Injury of conjunctiva and corneal abrasion without foreign body, right eye, initial encounter: Secondary | ICD-10-CM | POA: Diagnosis not present
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
## 2018-11-27 NOTE — Patient Instructions (Signed)
[place]
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
## 2018-12-11 ENCOUNTER — Ambulatory Visit (INDEPENDENT_AMBULATORY_CARE_PROVIDER_SITE_OTHER)
Admission: RE | Admit: 2018-12-11 | Discharge: 2018-12-11 | Disposition: A | Discharge status: Home / Self Care | Payer: Medicare Other | Source: Ambulatory Visit | Attending: Family Medicine | Admitting: Family Medicine

## 2018-12-11 ENCOUNTER — Ambulatory Visit (INDEPENDENT_AMBULATORY_CARE_PROVIDER_SITE_OTHER): Payer: Medicare Other | Admitting: Family Medicine

## 2018-12-11 ENCOUNTER — Other Ambulatory Visit: Payer: Self-pay

## 2018-12-11 ENCOUNTER — Encounter: Payer: Self-pay | Admitting: Family Medicine

## 2018-12-11 VITALS — BP 172/83 | HR 55 | Temp 97.6°F | Ht 70.0 in | Wt 166.5 lb

## 2018-12-11 DIAGNOSIS — S62112D Displaced fracture of triquetrum [cuneiform] bone, left wrist, subsequent encounter for fracture with routine healing: Secondary | ICD-10-CM | POA: Diagnosis not present

## 2018-12-11 DIAGNOSIS — S62115D Nondisplaced fracture of triquetrum [cuneiform] bone, left wrist, subsequent encounter for fracture with routine healing: Secondary | ICD-10-CM

## 2019-06-26 DIAGNOSIS — H93293 Other abnormal auditory perceptions, bilateral: Secondary | ICD-10-CM | POA: Diagnosis not present

## 2019-06-26 DIAGNOSIS — J301 Allergic rhinitis due to pollen: Secondary | ICD-10-CM | POA: Diagnosis not present

## 2019-06-26 DIAGNOSIS — H6123 Impacted cerumen, bilateral: Secondary | ICD-10-CM | POA: Diagnosis not present

## 2019-09-26 ENCOUNTER — Telehealth: Payer: Self-pay

## 2019-09-26 ENCOUNTER — Encounter: Payer: Self-pay | Admitting: Family Medicine

## 2019-09-26 ENCOUNTER — Ambulatory Visit (INDEPENDENT_AMBULATORY_CARE_PROVIDER_SITE_OTHER): Payer: Medicare Other | Admitting: Family Medicine

## 2019-09-26 ENCOUNTER — Other Ambulatory Visit: Payer: Self-pay

## 2019-09-26 VITALS — BP 162/86 | HR 64 | Temp 97.9°F | Ht 70.0 in | Wt 171.8 lb

## 2019-09-26 DIAGNOSIS — S39012A Strain of muscle, fascia and tendon of lower back, initial encounter: Secondary | ICD-10-CM

## 2019-09-26 DIAGNOSIS — R350 Frequency of micturition: Secondary | ICD-10-CM | POA: Diagnosis not present

## 2019-09-26 DIAGNOSIS — N401 Enlarged prostate with lower urinary tract symptoms: Secondary | ICD-10-CM | POA: Diagnosis not present

## 2019-09-26 MED ORDER — TAMSULOSIN HCL 0.4 MG PO CAPS: 0.4000 mg | ORAL_CAPSULE | Freq: Every day | ORAL | 3 refills | Status: DC

## 2019-09-26 NOTE — Progress Notes (Signed)
Subjective:    Patient ID: Scott Brennan, male    DOB: May 05, 1946, 74 y.o.   MRN: [id number]  HPI Chief Complaint  Patient presents with  . Back Pain    x 2 weeks, at times severe. Seen by Chiropracter recently. Pt states that this morning when he stretched he had intense pain shooting down his legs. Burning sensation at times in his hips. Legs felt very weak this morning.    Pain comes and goes. This am, severe pain with stretching. Some improvement with getting up and moving around. Pain, when severe, radiates down back of left leg. No change in bowel/ bladder habits. Similar pain last year, same side. Takes aspirin 500 mg 1-2 times a day with little relief. Saw chiropractor which seemed to help some. Was advised to use ice which provided temporary relief.   Walks briskly 5 miles daily (divided am/pm), uses rowing machine. Some increase in his weight, this bothers him. No stretching or core exercises.   Slightly decreased urine stream, nocturia x 3-4, no dysuria, no hematuria. Low fluid consumption always. No leaking.   Review of Systems Per HPI    Objective:   Physical Exam Vitals reviewed.  Constitutional:      General: He is not in acute distress.    Appearance: Normal appearance. He is normal weight. He is not ill-appearing, toxic-appearing or diaphoretic.  HENT:     Head: Normocephalic and atraumatic.  Eyes:     Conjunctiva/sclera: Conjunctivae normal.     Pupils: Pupils are equal, round, and reactive to light.  Cardiovascular:     Rate and Rhythm: Normal rate and regular rhythm.     Pulses: Normal pulses.     Heart sounds: Normal heart sounds.  Pulmonary:     Effort: Pulmonary effort is normal.     Breath sounds: Normal breath sounds.  Musculoskeletal:     Cervical back: Normal, normal range of motion and neck supple. No rigidity or tenderness.     Thoracic back: Normal.     Lumbar back: No tenderness or bony tenderness. Negative right straight leg raise test and  negative left straight leg raise test.     Right lower leg: No edema.     Left lower leg: No edema.     Comments: Gait normal. UE/LE strength 5/5. Pain with twisting, bent knee to chest. Tight hamstrings.   Skin:    General: Skin is warm and dry.  Neurological:     Mental Status: He is alert.     Motor: No weakness.     Gait: Gait normal.     Deep Tendon Reflexes: Reflexes normal.  Psychiatric:        Mood and Affect: Mood normal.        Behavior: Behavior normal.        Thought Content: Thought content normal.        Judgment: Judgment normal.       BP (!) 162/86 (BP Location: Left Arm, Patient Position: Sitting, Cuff Size: Normal) Comment: always elevated when in Doctor office  Pulse 64   Temp 97.9 F (36.6 C) (Temporal)   Ht 5\' 10"  (1.778 m)   Wt 171 lb 12.8 oz (77.9 kg)   SpO2 100%   BMI 24.65 kg/m  Wt Readings from Last 3 Encounters:  09/26/19 171 lb 12.8 oz (77.9 kg)  12/11/18 166 lb 8 oz (75.5 kg)  11/27/18 168 lb 12 oz (76.5 kg)   BP Readings from Last 3  Encounters:  09/26/19 (!) 162/86  12/11/18 (!) 172/83  11/27/18 (!) 186/86       Assessment & Plan:  1. Strain of lumbar region, initial encounter - no worrisome findings on history or physical exam - otc NSAIDs prn, heat, topical pain relievers - provided him verbal and written instructions for back exercises - RTC precautions reviewed  2. Benign prostatic hyperplasia with urinary frequency - offered labwork today, patient declined. - tamsulosin (FLOMAX) 0.4 MG CAPS capsule; Take 1 capsule (0.4 mg total) by mouth daily.  Dispense: 30 capsule; Refill: [place], FNP-BC  [facility] [place] [facility]  09/26/2019 12:58 PM

## 2019-09-26 NOTE — Telephone Encounter (Signed)
[name] - Client Nonclinical Telephone Record AccessNurse Client [name] - Client Client Site [facility] Physician Waunita Schooner- MD Contact Type Call Who Is Calling Patient / Member / Family / Caregiver Caller Name [place] Phone Number [phone] Patient Name Scott Brennan Patient DOB 01/18/1946 Call Type Message Only Information Provided Reason for Call Request to Schedule Office Appointment Initial Comment Caller states he was calling to see if he could get an immediate appt due to back pain. Additional Comment Provided office hours and advised to call back. Non symptomatic. Disp. Time Disposition Final User 09/26/2019 8:13:46 AM General Information Provided Yes Ovid Curd, [place] Call Closed By: Rolan Bucco Transaction Date/Time: 09/26/2019 8:10:16 AM (ET)

## 2019-09-26 NOTE — Telephone Encounter (Signed)
Noted  

## 2019-09-26 NOTE — Patient Instructions (Signed)
Good to see you today  Try ibuprofen 2 tablets every 8 to 12 hours instead of aspirin  Heat as needed  Look at [employer]- Yoga with Vincente Liberty, gentle yoga, gentle chair yoga, Tai Chi for beginners.    Low Back Sprain or Strain Rehab Ask your health care provider which exercises are safe for you. Do exercises exactly as told by your health care provider and adjust them as directed. It is normal to feel mild stretching, pulling, tightness, or discomfort as you do these exercises. Stop right away if you feel sudden pain or your pain gets worse. Do not begin these exercises until told by your health care provider. Stretching and range-of-motion exercises These exercises warm up your muscles and joints and improve the movement and flexibility of your back. These exercises also help to relieve pain, numbness, and tingling. Lumbar rotation  1. Lie on your back on a firm surface and bend your knees. 2. Straighten your arms out to your sides so each arm forms a 90-degree angle (right angle) with a side of your body. 3. Slowly move (rotate) both of your knees to one side of your body until you feel a stretch in your lower back (lumbar). Try not to let your shoulders lift off the floor. 4. Hold this position for __________ seconds. 5. Tense your abdominal muscles and slowly move your knees back to the starting position. 6. Repeat this exercise on the other side of your body. Repeat __________ times. Complete this exercise __________ times a day. Single knee to chest  1. Lie on your back on a firm surface with both legs straight. 2. Bend one of your knees. Use your hands to move your knee up toward your chest until you feel a gentle stretch in your lower back and buttock. ? Hold your leg in this position by holding on to the front of your knee. ? Keep your other leg as straight as possible. 3. Hold this position for __________ seconds. 4. Slowly return to the starting position. 5. Repeat with  your other leg. Repeat __________ times. Complete this exercise __________ times a day. Prone extension on elbows  1. Lie on your abdomen on a firm surface (prone position). 2. Prop yourself up on your elbows. 3. Use your arms to help lift your chest up until you feel a gentle stretch in your abdomen and your lower back. ? This will place some of your body weight on your elbows. If this is uncomfortable, try stacking pillows under your chest. ? Your hips should stay down, against the surface that you are lying on. Keep your hip and back muscles relaxed. 4. Hold this position for __________ seconds. 5. Slowly relax your upper body and return to the starting position. Repeat __________ times. Complete this exercise __________ times a day. Strengthening exercises These exercises build strength and endurance in your back. Endurance is the ability to use your muscles for a long time, even after they get tired. Pelvic tilt This exercise strengthens the muscles that lie deep in the abdomen. 1. Lie on your back on a firm surface. Bend your knees and keep your feet flat on the floor. 2. Tense your abdominal muscles. Tip your pelvis up toward the ceiling and flatten your lower back into the floor. ? To help with this exercise, you may place a small towel under your lower back and try to push your back into the towel. 3. Hold this position for __________ seconds. 4. Let your muscles  relax completely before you repeat this exercise. Repeat __________ times. Complete this exercise __________ times a day. Alternating arm and leg raises  1. Get on your hands and knees on a firm surface. If you are on a hard floor, you may want to use padding, such as an exercise mat, to cushion your knees. 2. Line up your arms and legs. Your hands should be directly below your shoulders, and your knees should be directly below your hips. 3. Lift your left leg behind you. At the same time, raise your right arm and  straighten it in front of you. ? Do not lift your leg higher than your hip. ? Do not lift your arm higher than your shoulder. ? Keep your abdominal and back muscles tight. ? Keep your hips facing the ground. ? Do not arch your back. ? Keep your balance carefully, and do not hold your breath. 4. Hold this position for __________ seconds. 5. Slowly return to the starting position. 6. Repeat with your right leg and your left arm. Repeat __________ times. Complete this exercise __________ times a day. Abdominal set with straight leg raise  1. Lie on your back on a firm surface. 2. Bend one of your knees and keep your other leg straight. 3. Tense your abdominal muscles and lift your straight leg up, 4-6 inches (10-15 cm) off the ground. 4. Keep your abdominal muscles tight and hold this position for __________ seconds. ? Do not hold your breath. ? Do not arch your back. Keep it flat against the ground. 5. Keep your abdominal muscles tense as you slowly lower your leg back to the starting position. 6. Repeat with your other leg. Repeat __________ times. Complete this exercise __________ times a day. Single leg lower with bent knees 1. Lie on your back on a firm surface. 2. Tense your abdominal muscles and lift your feet off the floor, one foot at a time, so your knees and hips are bent in 90-degree angles (right angles). ? Your knees should be over your hips and your lower legs should be parallel to the floor. 3. Keeping your abdominal muscles tense and your knee bent, slowly lower one of your legs so your toe touches the ground. 4. Lift your leg back up to return to the starting position. ? Do not hold your breath. ? Do not let your back arch. Keep your back flat against the ground. 5. Repeat with your other leg. Repeat __________ times. Complete this exercise __________ times a day. Posture and body mechanics Good posture and healthy body mechanics can help to relieve stress in your  body's tissues and joints. Body mechanics refers to the movements and positions of your body while you do your daily activities. Posture is part of body mechanics. Good posture means:  Your spine is in its natural S-curve position (neutral).  Your shoulders are pulled back slightly.  Your head is not tipped forward. Follow these guidelines to improve your posture and body mechanics in your everyday activities. Standing   When standing, keep your spine neutral and your feet about hip width apart. Keep a slight bend in your knees. Your ears, shoulders, and hips should line up.  When you do a task in which you stand in one place for a long time, place one foot up on a stable object that is 2-4 inches (5-10 cm) high, such as a footstool. This helps keep your spine neutral. Sitting   When sitting, keep your spine neutral and  keep your feet flat on the floor. Use a footrest, if necessary, and keep your thighs parallel to the floor. Avoid rounding your shoulders, and avoid tilting your head forward.  When working at a desk or a computer, keep your desk at a height where your hands are slightly lower than your elbows. Slide your chair under your desk so you are close enough to maintain good posture.  When working at a computer, place your monitor at a height where you are looking straight ahead and you do not have to tilt your head forward or downward to look at the screen. Resting  When lying down and resting, avoid positions that are most painful for you.  If you have pain with activities such as sitting, bending, stooping, or squatting, lie in a position in which your body does not bend very much. For example, avoid curling up on your side with your arms and knees near your chest (fetal position).  If you have pain with activities such as standing for a long time or reaching with your arms, lie with your spine in a neutral position and bend your knees slightly. Try the following  positions: ? Lying on your side with a pillow between your knees. ? Lying on your back with a pillow under your knees. Lifting   When lifting objects, keep your feet at least shoulder width apart and tighten your abdominal muscles.  Bend your knees and hips and keep your spine neutral. It is important to lift using the strength of your legs, not your back. Do not lock your knees straight out.  Always ask for help to lift heavy or awkward objects. This information is not intended to replace advice given to you by your health care provider. Make sure you discuss any questions you have with your health care provider. Document Revised: 11/22/2018 Document Reviewed: 08/22/2018 Elsevier Patient Education  [place].

## 2019-09-26 NOTE — Telephone Encounter (Signed)
Pt already has appt scheduled today at 10:30 with Glenda Chroman FNP.

## 2019-10-04 ENCOUNTER — Ambulatory Visit: Payer: Medicare Other | Attending: Internal Medicine

## 2019-10-04 DIAGNOSIS — Z23 Encounter for immunization: Secondary | ICD-10-CM

## 2019-10-04 NOTE — Progress Notes (Signed)
   Covid-19 Vaccination Clinic  Name:  Scott Brennan    MRN: [id number] DOB: [birth year]  10/04/2019  [name] was observed post Covid-19 immunization for 15 minutes without incidence. He was provided with Vaccine Information Sheet and instruction to access the V-Safe system.   [name] was instructed to call 911 with any severe reactions post vaccine: Marland Kitchen Difficulty breathing  . Swelling of your face and throat  . A fast heartbeat  . A bad rash all over your body  . Dizziness and weakness    Immunizations Administered    Name Date Dose VIS Date Route   Pfizer COVID-19 Vaccine 10/04/2019  3:15 PM 0.3 mL 07/25/2019 Intramuscular   Manufacturer: [place]   Lot: [id number]   [place]: [id number]

## 2019-10-05 IMAGING — DX LEFT WRIST - COMPLETE 3+ VIEW
3 series · 3 of 3 positions shown · non-contrast
Comparison: Radiographs November 27, 2018 and November 08, 2018.

CLINICAL DATA: Nondisplaced fracture of triquetrum.

EXAM:
LEFT WRIST - COMPLETE 3+ VIEW

[wrist ap]
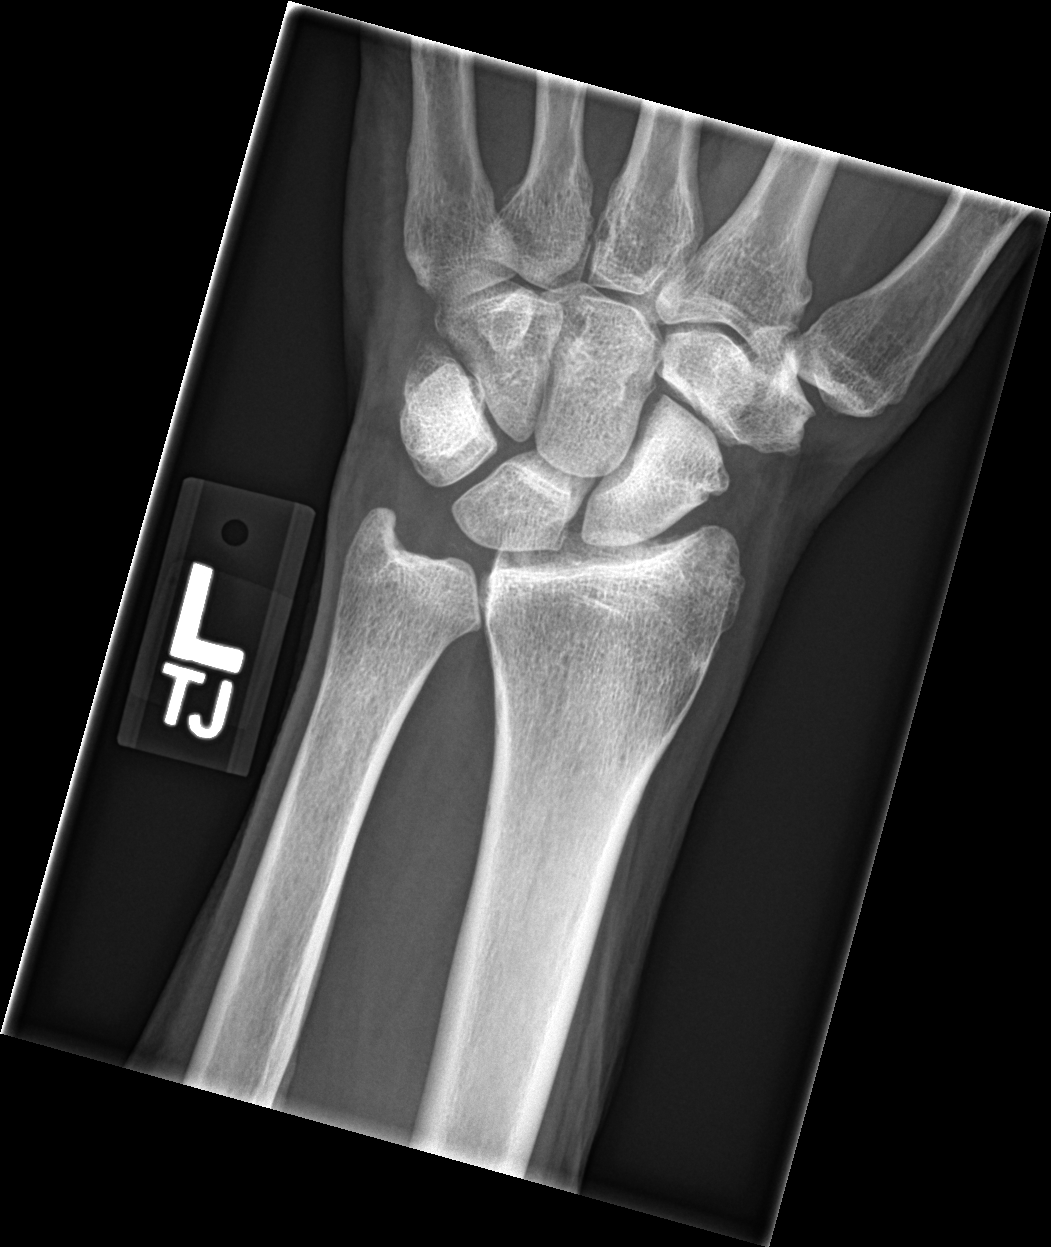

[wrist obl]
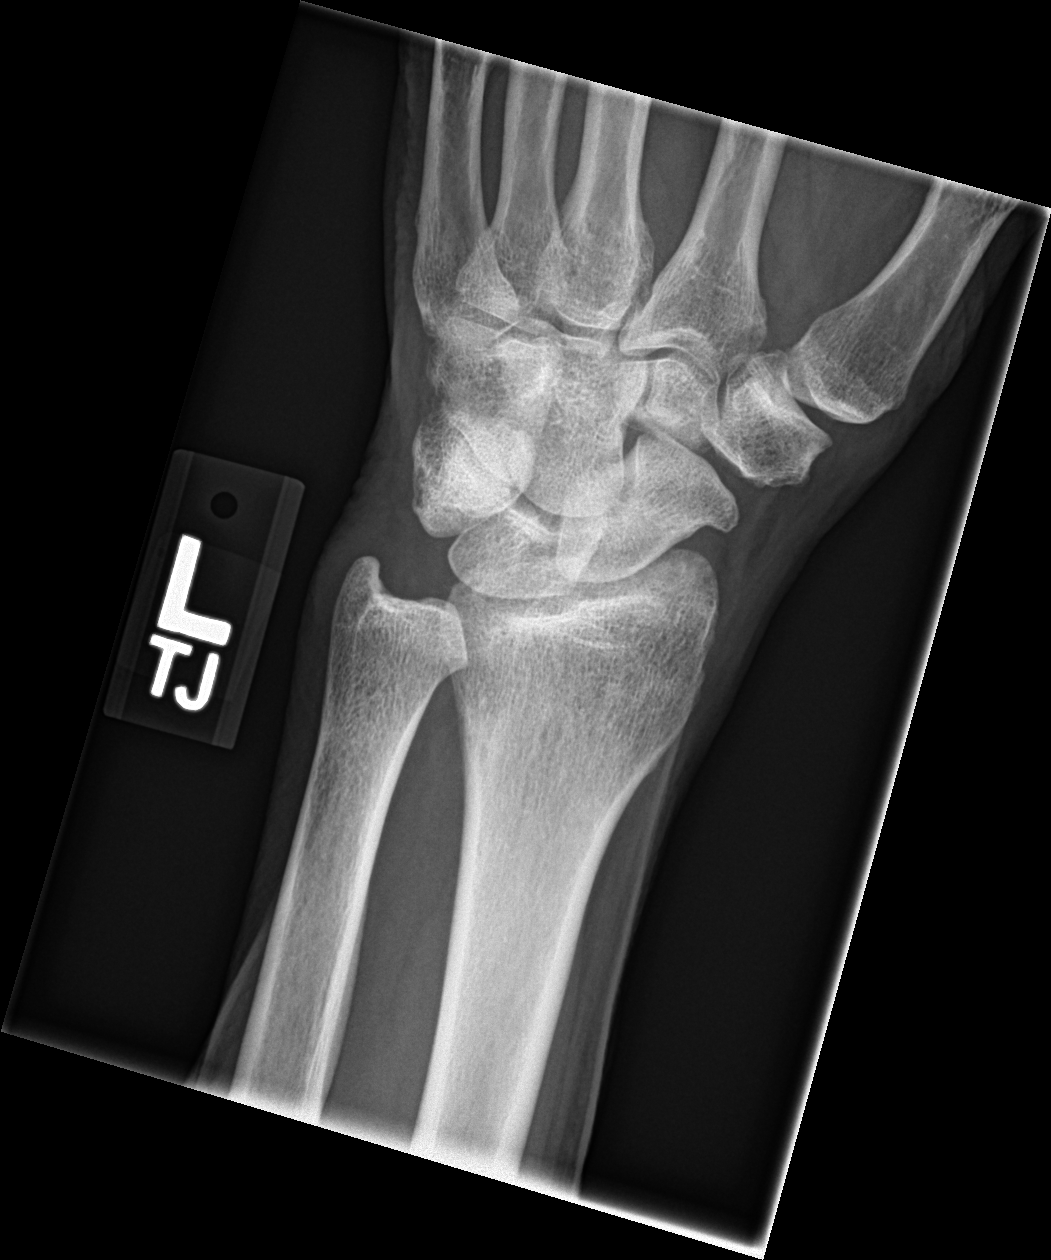

[wrist lat]
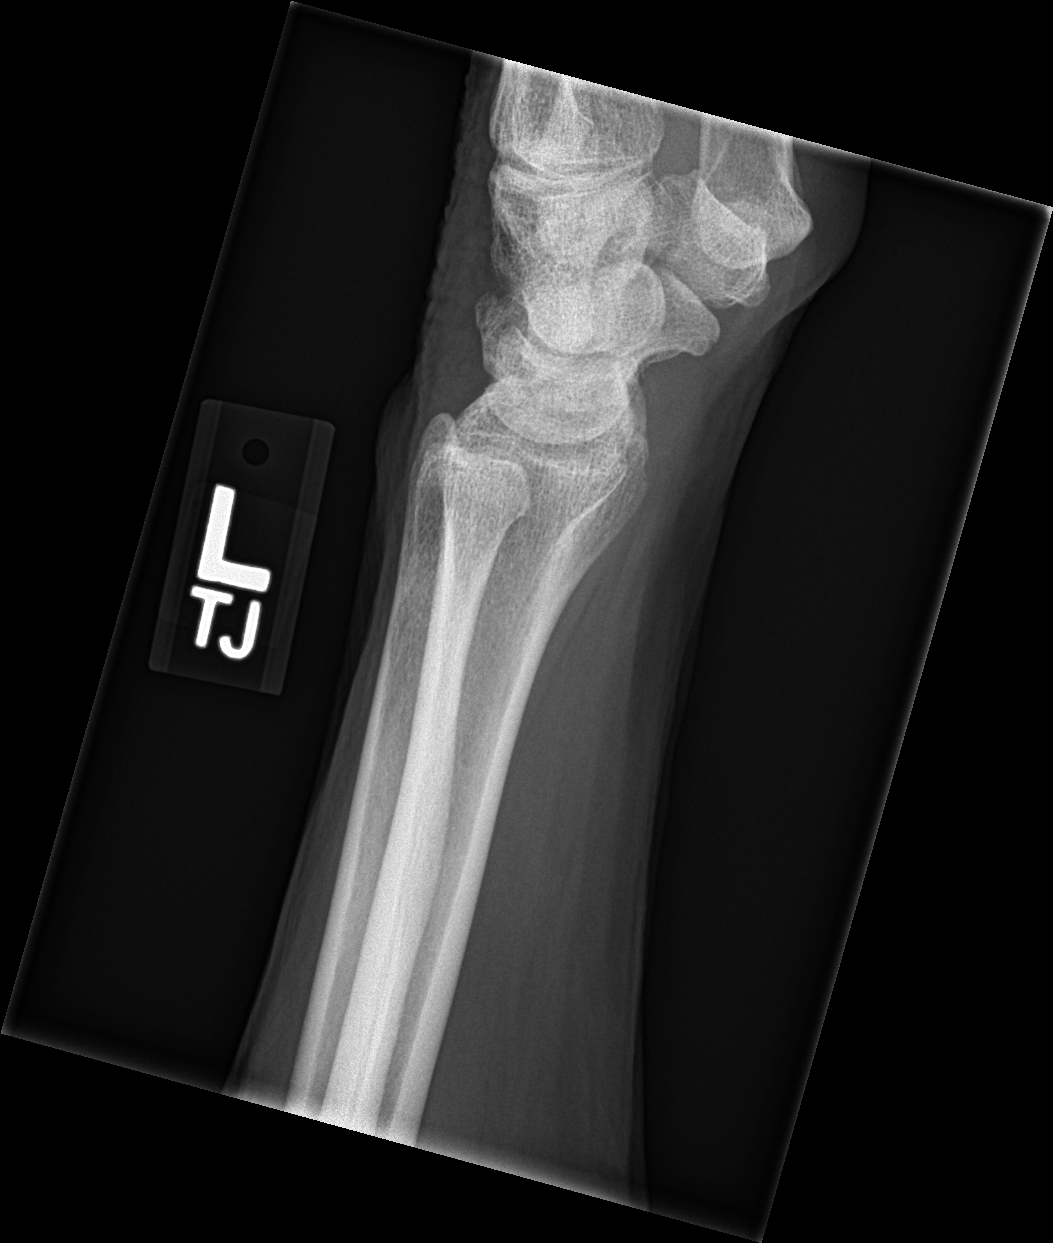

[3 of 3 positions shown; findings below may reference images not displayed]

FINDINGS: There is noted a minimally displaced fracture of the triquetrum of
indeterminate age. There is no evidence of arthropathy or other
focal bone abnormality. Soft tissues are unremarkable.
IMPRESSION: Stable appearance of minimally displaced fracture of triquetrum of
indeterminate age.

## 2019-10-06 DIAGNOSIS — M9903 Segmental and somatic dysfunction of lumbar region: Secondary | ICD-10-CM | POA: Diagnosis not present

## 2019-10-06 DIAGNOSIS — M5127 Other intervertebral disc displacement, lumbosacral region: Secondary | ICD-10-CM | POA: Diagnosis not present

## 2019-10-13 DIAGNOSIS — M5127 Other intervertebral disc displacement, lumbosacral region: Secondary | ICD-10-CM | POA: Diagnosis not present

## 2019-10-13 DIAGNOSIS — M9903 Segmental and somatic dysfunction of lumbar region: Secondary | ICD-10-CM | POA: Diagnosis not present

## 2019-10-29 ENCOUNTER — Ambulatory Visit: Payer: Medicare Other | Attending: Internal Medicine

## 2019-10-29 DIAGNOSIS — Z23 Encounter for immunization: Secondary | ICD-10-CM

## 2019-10-29 NOTE — Progress Notes (Signed)
   Covid-19 Vaccination Clinic  Name:  Scott Brennan    MRN: [id number] DOB: 11/30/1945  10/29/2019  [name] was observed post Covid-19 immunization for 15 minutes without incident. He was provided with Vaccine Information Sheet and instruction to access the V-Safe system.   [name] was instructed to call 911 with any severe reactions post vaccine: Marland Kitchen Difficulty breathing  . Swelling of face and throat  . A fast heartbeat  . A bad rash all over body  . Dizziness and weakness   Immunizations Administered    Name Date Dose VIS Date Route   Pfizer COVID-19 Vaccine 10/29/2019  9:53 AM 0.3 mL 07/25/2019 Intramuscular   Manufacturer: [place]   Lot: [id number]   [place]: [id number]

## 2019-12-18 ENCOUNTER — Other Ambulatory Visit: Payer: Self-pay | Admitting: Family Medicine

## 2019-12-18 DIAGNOSIS — R350 Frequency of micturition: Secondary | ICD-10-CM

## 2019-12-18 DIAGNOSIS — N401 Enlarged prostate with lower urinary tract symptoms: Secondary | ICD-10-CM

## 2019-12-24 ENCOUNTER — Encounter: Payer: Self-pay | Admitting: Family Medicine

## 2019-12-24 ENCOUNTER — Ambulatory Visit (INDEPENDENT_AMBULATORY_CARE_PROVIDER_SITE_OTHER): Payer: Medicare Other | Admitting: Family Medicine

## 2019-12-24 ENCOUNTER — Other Ambulatory Visit: Payer: Self-pay

## 2019-12-24 VITALS — BP 140/74 | HR 55 | Temp 98.3°F | Resp 14 | Wt 173.2 lb

## 2019-12-24 DIAGNOSIS — Z1159 Encounter for screening for other viral diseases: Secondary | ICD-10-CM | POA: Diagnosis not present

## 2019-12-24 DIAGNOSIS — E782 Mixed hyperlipidemia: Secondary | ICD-10-CM

## 2019-12-24 DIAGNOSIS — I10 Essential (primary) hypertension: Secondary | ICD-10-CM

## 2019-12-24 DIAGNOSIS — H6690 Otitis media, unspecified, unspecified ear: Secondary | ICD-10-CM | POA: Diagnosis not present

## 2019-12-24 DIAGNOSIS — I700 Atherosclerosis of aorta: Secondary | ICD-10-CM

## 2019-12-24 DIAGNOSIS — L219 Seborrheic dermatitis, unspecified: Secondary | ICD-10-CM | POA: Insufficient documentation

## 2019-12-24 LAB — LIPID PANEL
Cholesterol: 215 mg/dL — ABNORMAL HIGH (ref 0–200)
HDL: 61.9 mg/dL (ref >39.00)
LDL Cholesterol: 143 mg/dL — ABNORMAL HIGH (ref 0–99)
NonHDL: 153.27
Total CHOL/HDL Ratio: 3
Triglycerides: 53 mg/dL (ref 0.0–149.0)
VLDL: 10.6 mg/dL (ref 0.0–40.0)

## 2019-12-24 LAB — COMPREHENSIVE METABOLIC PANEL
ALT: 17 U/L (ref 0–53)
AST: 21 U/L (ref 0–37)
Albumin: 4 g/dL (ref 3.5–5.2)
Alkaline Phosphatase: 67 U/L (ref 39–117)
BUN: 26 mg/dL — ABNORMAL HIGH (ref 6–23)
CO2: 33 mEq/L — ABNORMAL HIGH (ref 19–32)
Calcium: 9.3 mg/dL (ref 8.4–10.5)
Chloride: 102 mEq/L (ref 96–112)
Creatinine, Ser: 1.11 mg/dL (ref 0.40–1.50)
GFR: 64.73 mL/min (ref >60.00)
Glucose, Bld: 102 mg/dL — ABNORMAL HIGH (ref 70–99)
Potassium: 4.6 mEq/L (ref 3.5–5.1)
Sodium: 139 mEq/L (ref 135–145)
Total Bilirubin: 0.5 mg/dL (ref 0.2–1.2)
Total Protein: 6.5 g/dL (ref 6.0–8.3)

## 2019-12-24 MED ORDER — AMOXICILLIN-POT CLAVULANATE 875-125 MG PO TABS: 1.0000 | ORAL_TABLET | Freq: Two times a day (BID) | ORAL | 0 refills | Status: DC

## 2019-12-24 MED ORDER — BETAMETHASONE DIPROPIONATE AUG 0.05 % EX LOTN: TOPICAL_LOTION | Freq: Two times a day (BID) | CUTANEOUS | 0 refills | Status: DC

## 2019-12-24 NOTE — Progress Notes (Signed)
Subjective:     Scott Brennan is a 74 y.o. male presenting for Ear Pain (left ear, x 1 week.)     Otalgia  There is pain in the left ear. This is a new problem. The current episode started in the past 7 days. Episode frequency: worse in the evening. The problem has been waxing and waning. There has been no fever. The pain is moderate. Associated symptoms include headaches and hearing loss (chronic). Pertinent negatives include no coughing, ear discharge, rash, rhinorrhea, sore throat or vomiting. Associated symptoms comments: Dental pain.   Left ear hx of ear wax in large quantities   Review of Systems  HENT: Positive for dental problem, ear pain, hearing loss (chronic) and tinnitus. Negative for ear discharge, rhinorrhea and sore throat.   Respiratory: Negative for cough.   Gastrointestinal: Negative for vomiting.  Skin: Negative for rash.  Neurological: Positive for headaches.    #[place] HTN - hx of dizziness episodes  - saw cardiology - home monitoring 120-140  Social History   Tobacco Use  Smoking Status Former Smoker  . Packs/day: 1.00  . Years: 37.00  . Pack years: 37.00  . Types: Cigarettes  . Quit date: 09/15/2003  . Years since quitting: 16.2  Smokeless Tobacco Never Used        Objective:    BP Readings from Last 3 Encounters:  12/24/19 140/74  09/26/19 (!) 162/86  12/11/18 (!) 172/83   Wt Readings from Last 3 Encounters:  12/24/19 173 lb 4 oz (78.6 kg)  09/26/19 171 lb 12.8 oz (77.9 kg)  12/11/18 166 lb 8 oz (75.5 kg)    BP 140/74   Pulse (!) 55   Temp 98.3 F (36.8 C)   Resp 14   Wt 173 lb 4 oz (78.6 kg)   SpO2 100%   BMI 24.86 kg/m    Physical Exam Constitutional:      Appearance: Normal appearance. He is not ill-appearing or diaphoretic.  HENT:     Head:     Comments: Pt with very narrow ear canals and unable to fully visualize the TM bilaterally. Mild erythema on the left ear canal but no obvious sign of otitis externa.   Right Ear: External ear normal.     Left Ear: External ear normal.  Eyes:     General: No scleral icterus.    Extraocular Movements: Extraocular movements intact.     Conjunctiva/sclera: Conjunctivae normal.  Cardiovascular:     Rate and Rhythm: Normal rate.  Pulmonary:     Effort: Pulmonary effort is normal.  Musculoskeletal:     Cervical back: Neck supple.  Skin:    General: Skin is warm and dry.  Neurological:     Mental Status: He is alert. Mental status is at baseline.  Psychiatric:        Mood and Affect: Mood normal.          Assessment & Plan:   Problem List Items Addressed This Visit      Cardiovascular and Mediastinum   Essential hypertension    Improved on repeat and normal home monitoring. Continue to monitor. Encouraged hydration.       Relevant Orders   Comprehensive metabolic panel   Aortic atherosclerosis ([place])    Unable to find the CT with this noted. If present would benefit from statin. Per cardiology visit from last year he declined. Will plan for lipid check today and reassess.        Musculoskeletal and  Integument   Seborrheic dermatitis of scalp    Requested refill of betamethasone which worked in the past      Relevant Medications   betamethasone, augmented, (DIPROLENE) 0.05 % lotion     Other   Mixed hyperlipidemia    Overdue for labs and not currently on statin. Repeat lipids and re-evaluate need for treatment.       Relevant Orders   Lipid panel    Other Visit Diagnoses    Acute otitis media, unspecified otitis media type    -  Primary   Relevant Medications   amoxicillin-clavulanate (AUGMENTIN) 875-125 MG tablet   Encounter for hepatitis C screening test for low risk patient       Relevant Orders   Hepatitis C antibody     Due to difficulty assessing ear and severity of symptoms will treat empirically. If not improvement he will f/u with ENT.   Return in about 1 year (around 12/23/2020).  Lesleigh Noe, MD

## 2019-12-24 NOTE — Patient Instructions (Addendum)
Take the antibiotics If not better after 1 week call ENT

## 2019-12-24 NOTE — Assessment & Plan Note (Signed)
Improved on repeat and normal home monitoring. Continue to monitor. Encouraged hydration.

## 2019-12-24 NOTE — Assessment & Plan Note (Signed)
Unable to find the CT with this noted. If present would benefit from statin. Per cardiology visit from last year he declined. Will plan for lipid check today and reassess.

## 2019-12-24 NOTE — Assessment & Plan Note (Signed)
Overdue for labs and not currently on statin. Repeat lipids and re-evaluate need for treatment.

## 2019-12-24 NOTE — Assessment & Plan Note (Signed)
Requested refill of betamethasone which worked in the past

## 2019-12-25 LAB — HEPATITIS C ANTIBODY
Hepatitis C Ab: NONREACTIVE
SIGNAL TO CUT-OFF: 0.02 (ref <1.00)

## 2019-12-26 ENCOUNTER — Telehealth: Payer: Self-pay

## 2019-12-26 DIAGNOSIS — H6690 Otitis media, unspecified, unspecified ear: Secondary | ICD-10-CM

## 2019-12-26 MED ORDER — CEFDINIR 300 MG PO CAPS: 300.0000 mg | ORAL_CAPSULE | Freq: Two times a day (BID) | ORAL | 0 refills | Status: AC

## 2019-12-26 NOTE — Addendum Note (Signed)
Addended by: Kris Mouton on: 12/26/2019 01:33 PM   Modules accepted: Orders

## 2019-12-26 NOTE — Telephone Encounter (Signed)
Patient l/m on triage line stating that Dr Einar Pheasant put him on Augmentin on 12/24/19. He has been taking this medication since then with food but has noticed that it makes him feel drowsy and nauseas. He also threw up 3 times yesterday-12/25/19. No other symptoms.   Please advise. CB is [phone]

## 2019-12-26 NOTE — Telephone Encounter (Signed)
I've sent in an alternative antibiotic to take.   Would recommend stopping Augmentin due to intolerance and starting Cefdinir.

## 2019-12-26 NOTE — Telephone Encounter (Signed)
Patient advised. D/C Augmentin from medication list and added to his allergy/intolerance list.

## 2019-12-29 DIAGNOSIS — H6983 Other specified disorders of Eustachian tube, bilateral: Secondary | ICD-10-CM | POA: Diagnosis not present

## 2019-12-29 DIAGNOSIS — H6061 Unspecified chronic otitis externa, right ear: Secondary | ICD-10-CM | POA: Diagnosis not present

## 2019-12-29 DIAGNOSIS — H60393 Other infective otitis externa, bilateral: Secondary | ICD-10-CM | POA: Diagnosis not present

## 2020-01-09 ENCOUNTER — Telehealth: Payer: Self-pay | Admitting: Family Medicine

## 2020-01-09 DIAGNOSIS — E782 Mixed hyperlipidemia: Secondary | ICD-10-CM

## 2020-01-09 MED ORDER — ATORVASTATIN CALCIUM 20 MG PO TABS: 20.0000 mg | ORAL_TABLET | Freq: Every evening | ORAL | 0 refills | Status: DC

## 2020-01-09 NOTE — Telephone Encounter (Signed)
Patient called about his lab results. He seen the results and stated that he would like to start the cholesterol medication and would like it sent to his pharmacy     [id number] - [place], [place]

## 2020-01-09 NOTE — Telephone Encounter (Signed)
Please notify patient that we will send in atorvastatin 20 mg to his pharmacy. We need to recheck his cholesterol and liver enzymes in 2 months to ensure improvement. Please set up a lab only appointment to have this done.

## 2020-01-09 NOTE — Telephone Encounter (Signed)
Advised pt and scheduled for a lab apt. Pt verbalized understanding.

## 2020-01-13 NOTE — Addendum Note (Signed)
Addended by: Waunita Schooner R on: 01/13/2020 09:30 AM   Modules accepted: Orders

## 2020-01-13 NOTE — Telephone Encounter (Signed)
Labs ordered.

## 2020-03-12 ENCOUNTER — Other Ambulatory Visit (INDEPENDENT_AMBULATORY_CARE_PROVIDER_SITE_OTHER): Payer: Medicare Other

## 2020-03-12 ENCOUNTER — Other Ambulatory Visit: Payer: Self-pay

## 2020-03-12 DIAGNOSIS — E782 Mixed hyperlipidemia: Secondary | ICD-10-CM | POA: Diagnosis not present

## 2020-03-12 LAB — HEPATIC FUNCTION PANEL
ALT: 20 U/L (ref 0–53)
AST: 24 U/L (ref 0–37)
Albumin: 4 g/dL (ref 3.5–5.2)
Alkaline Phosphatase: 66 U/L (ref 39–117)
Bilirubin, Direct: 0.1 mg/dL (ref 0.0–0.3)
Total Bilirubin: 0.6 mg/dL (ref 0.2–1.2)
Total Protein: 6.5 g/dL (ref 6.0–8.3)

## 2020-03-12 LAB — LIPID PANEL
Cholesterol: 158 mg/dL (ref 0–200)
HDL: 66.2 mg/dL (ref >39.00)
LDL Cholesterol: 84 mg/dL (ref 0–99)
NonHDL: 92.13
Total CHOL/HDL Ratio: 2
Triglycerides: 40 mg/dL (ref 0.0–149.0)
VLDL: 8 mg/dL (ref 0.0–40.0)

## 2020-04-05 ENCOUNTER — Other Ambulatory Visit: Payer: Self-pay | Admitting: Family Medicine

## 2020-04-05 DIAGNOSIS — N401 Enlarged prostate with lower urinary tract symptoms: Secondary | ICD-10-CM

## 2020-04-08 ENCOUNTER — Other Ambulatory Visit: Payer: Self-pay | Admitting: Primary Care

## 2020-04-08 DIAGNOSIS — E782 Mixed hyperlipidemia: Secondary | ICD-10-CM

## 2020-04-08 NOTE — Telephone Encounter (Signed)
Last prescribed by Allie Bossier on 01/09/2020 Last OV  with Dr Einar Pheasant on 12/24/2019 No future OV scheduled

## 2020-04-08 NOTE — Telephone Encounter (Signed)
Pt following up on prescription. He said he has 4 pills remaining.

## 2020-06-19 ENCOUNTER — Other Ambulatory Visit: Payer: Self-pay | Admitting: Family Medicine

## 2020-06-19 DIAGNOSIS — N401 Enlarged prostate with lower urinary tract symptoms: Secondary | ICD-10-CM

## 2020-09-14 DIAGNOSIS — L820 Inflamed seborrheic keratosis: Secondary | ICD-10-CM | POA: Diagnosis not present

## 2020-09-14 DIAGNOSIS — L218 Other seborrheic dermatitis: Secondary | ICD-10-CM | POA: Diagnosis not present

## 2020-09-14 DIAGNOSIS — L720 Epidermal cyst: Secondary | ICD-10-CM | POA: Diagnosis not present

## 2020-11-11 DIAGNOSIS — H6122 Impacted cerumen, left ear: Secondary | ICD-10-CM | POA: Diagnosis not present

## 2020-11-11 DIAGNOSIS — H93293 Other abnormal auditory perceptions, bilateral: Secondary | ICD-10-CM | POA: Diagnosis not present

## 2020-11-25 DIAGNOSIS — H93292 Other abnormal auditory perceptions, left ear: Secondary | ICD-10-CM | POA: Diagnosis not present

## 2020-11-25 DIAGNOSIS — H6123 Impacted cerumen, bilateral: Secondary | ICD-10-CM | POA: Diagnosis not present

## 2020-12-13 ENCOUNTER — Other Ambulatory Visit: Payer: Self-pay | Admitting: Family Medicine

## 2020-12-13 DIAGNOSIS — N401 Enlarged prostate with lower urinary tract symptoms: Secondary | ICD-10-CM

## 2020-12-13 NOTE — Telephone Encounter (Signed)
Please scheduled MWV with Calandra and CPE with Dr. Einar Pheasant.

## 2020-12-16 NOTE — Telephone Encounter (Signed)
Attempted to reach patient to schedule. Pt's wife states she will have him call the office

## 2021-01-31 DIAGNOSIS — H60392 Other infective otitis externa, left ear: Secondary | ICD-10-CM | POA: Diagnosis not present

## 2021-03-13 ENCOUNTER — Other Ambulatory Visit: Payer: Self-pay | Admitting: Family Medicine

## 2021-03-13 DIAGNOSIS — N401 Enlarged prostate with lower urinary tract symptoms: Secondary | ICD-10-CM

## 2021-04-08 ENCOUNTER — Other Ambulatory Visit: Payer: Self-pay | Admitting: Family Medicine

## 2021-04-08 DIAGNOSIS — E782 Mixed hyperlipidemia: Secondary | ICD-10-CM

## 2021-04-11 NOTE — Telephone Encounter (Signed)
Please schedule patient for Medicare Wellness visit with Dr Einar Pheasant. LOV 12/24/19.  Will refill medication once appointment is made. Thank you

## 2021-04-12 NOTE — Telephone Encounter (Signed)
Pt is scheduled for 9/20

## 2021-05-03 ENCOUNTER — Ambulatory Visit (INDEPENDENT_AMBULATORY_CARE_PROVIDER_SITE_OTHER): Payer: Medicare Other | Admitting: Family Medicine

## 2021-05-03 ENCOUNTER — Other Ambulatory Visit: Payer: Self-pay

## 2021-05-03 VITALS — BP 146/72 | HR 52 | Temp 97.0°F | Ht 70.0 in | Wt 172.1 lb

## 2021-05-03 DIAGNOSIS — I10 Essential (primary) hypertension: Secondary | ICD-10-CM | POA: Diagnosis not present

## 2021-05-03 DIAGNOSIS — Z0000 Encounter for general adult medical examination without abnormal findings: Secondary | ICD-10-CM

## 2021-05-03 DIAGNOSIS — Z23 Encounter for immunization: Secondary | ICD-10-CM | POA: Diagnosis not present

## 2021-05-03 DIAGNOSIS — E782 Mixed hyperlipidemia: Secondary | ICD-10-CM

## 2021-05-03 DIAGNOSIS — I700 Atherosclerosis of aorta: Secondary | ICD-10-CM | POA: Diagnosis not present

## 2021-05-03 LAB — COMPREHENSIVE METABOLIC PANEL
ALT: 25 U/L (ref 0–53)
AST: 29 U/L (ref 0–37)
Albumin: 4.1 g/dL (ref 3.5–5.2)
Alkaline Phosphatase: 69 U/L (ref 39–117)
BUN: 19 mg/dL (ref 6–23)
CO2: 33 mEq/L — ABNORMAL HIGH (ref 19–32)
Calcium: 9.3 mg/dL (ref 8.4–10.5)
Chloride: 103 mEq/L (ref 96–112)
Creatinine, Ser: 1.09 mg/dL (ref 0.40–1.50)
GFR: 66.38 mL/min (ref >60.00)
Glucose, Bld: 86 mg/dL (ref 70–99)
Potassium: 4.5 mEq/L (ref 3.5–5.1)
Sodium: 141 mEq/L (ref 135–145)
Total Bilirubin: 0.7 mg/dL (ref 0.2–1.2)
Total Protein: 6.7 g/dL (ref 6.0–8.3)

## 2021-05-03 LAB — LIPID PANEL
Cholesterol: 157 mg/dL (ref 0–200)
HDL: 66 mg/dL (ref >39.00)
LDL Cholesterol: 74 mg/dL (ref 0–99)
NonHDL: 91.16
Total CHOL/HDL Ratio: 2
Triglycerides: 84 mg/dL (ref 0.0–149.0)
VLDL: 16.8 mg/dL (ref 0.0–40.0)

## 2021-05-03 MED ORDER — ATORVASTATIN CALCIUM 20 MG PO TABS: 20.0000 mg | ORAL_TABLET | Freq: Every evening | ORAL | 3 refills | Status: DC

## 2021-05-03 NOTE — Assessment & Plan Note (Signed)
Not currently on medication. Advised home monitoring and return if elevated.

## 2021-05-03 NOTE — Assessment & Plan Note (Signed)
Cont statin. Lipids today
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
## 2021-05-03 NOTE — Patient Instructions (Addendum)
Shingles Vaccine - would recommend getting with your pharmacy   Call or schedule follow-up visit if you notice your blood pressure >140/90 when you give blood

## 2021-08-16 ENCOUNTER — Ambulatory Visit: Payer: Medicare Other | Admitting: Podiatry

## 2021-08-16 ENCOUNTER — Encounter: Payer: Self-pay | Admitting: Podiatry

## 2021-08-16 ENCOUNTER — Other Ambulatory Visit: Payer: Self-pay

## 2021-08-16 DIAGNOSIS — L603 Nail dystrophy: Secondary | ICD-10-CM | POA: Diagnosis not present
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
## 2021-11-09 DIAGNOSIS — H6122 Impacted cerumen, left ear: Secondary | ICD-10-CM | POA: Diagnosis not present

## 2021-11-09 DIAGNOSIS — H6063 Unspecified chronic otitis externa, bilateral: Secondary | ICD-10-CM | POA: Diagnosis not present

## 2021-11-09 DIAGNOSIS — H60393 Other infective otitis externa, bilateral: Secondary | ICD-10-CM | POA: Diagnosis not present

## 2022-01-16 DIAGNOSIS — H2513 Age-related nuclear cataract, bilateral: Secondary | ICD-10-CM | POA: Diagnosis not present

## 2022-01-16 DIAGNOSIS — D3132 Benign neoplasm of left choroid: Secondary | ICD-10-CM | POA: Diagnosis not present

## 2022-01-16 DIAGNOSIS — H353131 Nonexudative age-related macular degeneration, bilateral, early dry stage: Secondary | ICD-10-CM | POA: Diagnosis not present

## 2022-01-16 DIAGNOSIS — H5203 Hypermetropia, bilateral: Secondary | ICD-10-CM | POA: Diagnosis not present

## 2022-01-16 DIAGNOSIS — H52223 Regular astigmatism, bilateral: Secondary | ICD-10-CM | POA: Diagnosis not present

## 2022-01-16 DIAGNOSIS — H524 Presbyopia: Secondary | ICD-10-CM | POA: Diagnosis not present

## 2022-04-04 NOTE — Telephone Encounter (Signed)
Pt dropped off power of attorney paperwork to be put into his files. Paperwork will be in pcp's folder.

## 2022-05-09 ENCOUNTER — Ambulatory Visit (INDEPENDENT_AMBULATORY_CARE_PROVIDER_SITE_OTHER): Payer: Medicare Other

## 2022-05-09 VITALS — Ht 70.0 in | Wt 172.0 lb

## 2022-05-09 DIAGNOSIS — Z0000 Encounter for general adult medical examination without abnormal findings: Secondary | ICD-10-CM
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
## 2022-05-09 NOTE — Patient Instructions (Signed)
Scott Brennan , Thank you for taking time to come for your Medicare Wellness Visit. I appreciate your ongoing commitment to your health goals. Please review the following plan we discussed and let me know if I can assist you in the future.   Screening recommendations/referrals: Colonoscopy: aged out Recommended yearly ophthalmology/optometry visit for glaucoma screening and checkup Recommended yearly dental visit for hygiene and checkup  Vaccinations: Influenza vaccine: 05/03/21 Pneumococcal vaccine: 08/19/18 Tdap vaccine: 08/19/18 Shingles vaccine: n/d   Covid-19: 10/04/19, 10/29/19, 07/06/20, 06/01/21  Advanced directives: yes  Conditions/risks identified: none  Next appointment: Follow up in one year for your annual wellness visit. 05/11/23 @ 1:15 pm by phone  Preventive Care 76 Years and Older, Male Preventive care refers to lifestyle choices and visits with your health care provider that can promote health and wellness. What does preventive care include? A yearly physical exam. This is also called an annual well check. Dental exams once or twice a year. Routine eye exams. Ask your health care provider how often you should have your eyes checked. Personal lifestyle choices, including: Daily care of your teeth and gums. Regular physical activity. Eating a healthy diet. Avoiding tobacco and drug use. Limiting alcohol use. Practicing safe sex. Taking low doses of aspirin every day. Taking vitamin and mineral supplements as recommended by your health care provider. What happens during an annual well check? The services and screenings done by your health care provider during your annual well check will depend on your age, overall health, lifestyle risk factors, and family history of disease. Counseling  Your health care provider may ask you questions about your: Alcohol use. Tobacco use. Drug use. Emotional well-being. Home and relationship well-being. Sexual activity. Eating  habits. History of falls. Memory and ability to understand (cognition). Work and work Statistician. Screening  You may have the following tests or measurements: Height, weight, and BMI. Blood pressure. Lipid and cholesterol levels. These may be checked every 5 years, or more frequently if you are over 33 years old. Skin check. Lung cancer screening. You may have this screening every year starting at age 46 if you have a 30-pack-year history of smoking and currently smoke or have quit within the past 15 years. Fecal occult blood test (FOBT) of the stool. You may have this test every year starting at age 4. Flexible sigmoidoscopy or colonoscopy. You may have a sigmoidoscopy every 5 years or a colonoscopy every 10 years starting at age 14. Prostate cancer screening. Recommendations will vary depending on your family history and other risks. Hepatitis C blood test. Hepatitis B blood test. Sexually transmitted disease (STD) testing. Diabetes screening. This is done by checking your blood sugar (glucose) after you have not eaten for a while (fasting). You may have this done every 1-3 years. Abdominal aortic aneurysm (AAA) screening. You may need this if you are a current or former smoker. Osteoporosis. You may be screened starting at age 21 if you are at high risk. Talk with your health care provider about your test results, treatment options, and if necessary, the need for more tests. Vaccines  Your health care provider may recommend certain vaccines, such as: Influenza vaccine. This is recommended every year. Tetanus, diphtheria, and acellular pertussis (Tdap, Td) vaccine. You may need a Td booster every 10 years. Zoster vaccine. You may need this after age 76. Pneumococcal 13-valent conjugate (PCV13) vaccine. One dose is recommended after age 9. Pneumococcal polysaccharide (PPSV23) vaccine. One dose is recommended after age 54. Talk to your  health care provider about which screenings and  vaccines you need and how often you need them. This information is not intended to replace advice given to you by your health care provider. Make sure you discuss any questions you have with your health care provider. Document Released: 08/27/2015 Document Revised: 04/19/2016 Document Reviewed: 06/01/2015 Elsevier Interactive Patient Education  2017 [place] Prevention in the Home Falls can cause injuries. They can happen to people of all ages. There are many things you can do to make your home safe and to help prevent falls. What can I do on the outside of my home? Regularly fix the edges of walkways and driveways and fix any cracks. Remove anything that might make you trip as you walk through a door, such as a raised step or threshold. Trim any bushes or trees on the path to your home. Use bright outdoor lighting. Clear any walking paths of anything that might make someone trip, such as rocks or tools. Regularly check to see if handrails are loose or broken. Make sure that both sides of any steps have handrails. Any raised decks and porches should have guardrails on the edges. Have any leaves, snow, or ice cleared regularly. Use sand or salt on walking paths during winter. Clean up any spills in your garage right away. This includes oil or grease spills. What can I do in the bathroom? Use night lights. Install grab bars by the toilet and in the tub and shower. Do not use towel bars as grab bars. Use non-skid mats or decals in the tub or shower. If you need to sit down in the shower, use a plastic, non-slip stool. Keep the floor dry. Clean up any water that spills on the floor as soon as it happens. Remove soap buildup in the tub or shower regularly. Attach bath mats securely with double-sided non-slip rug tape. Do not have throw rugs and other things on the floor that can make you trip. What can I do in the bedroom? Use night lights. Make sure that you have a light by your  bed that is easy to reach. Do not use any sheets or blankets that are too big for your bed. They should not hang down onto the floor. Have a firm chair that has side arms. You can use this for support while you get dressed. Do not have throw rugs and other things on the floor that can make you trip. What can I do in the kitchen? Clean up any spills right away. Avoid walking on wet floors. Keep items that you use a lot in easy-to-reach places. If you need to reach something above you, use a strong step stool that has a grab bar. Keep electrical cords out of the way. Do not use floor polish or wax that makes floors slippery. If you must use wax, use non-skid floor wax. Do not have throw rugs and other things on the floor that can make you trip. What can I do with my stairs? Do not leave any items on the stairs. Make sure that there are handrails on both sides of the stairs and use them. Fix handrails that are broken or loose. Make sure that handrails are as long as the stairways. Check any carpeting to make sure that it is firmly attached to the stairs. Fix any carpet that is loose or worn. Avoid having throw rugs at the top or bottom of the stairs. If you do have throw rugs, attach them  to the floor with carpet tape. Make sure that you have a light switch at the top of the stairs and the bottom of the stairs. If you do not have them, ask someone to add them for you. What else can I do to help prevent falls? Wear shoes that: Do not have high heels. Have rubber bottoms. Are comfortable and fit you well. Are closed at the toe. Do not wear sandals. If you use a stepladder: Make sure that it is fully opened. Do not climb a closed stepladder. Make sure that both sides of the stepladder are locked into place. Ask someone to hold it for you, if possible. Clearly mark and make sure that you can see: Any grab bars or handrails. First and last steps. Where the edge of each step is. Use tools that  help you move around (mobility aids) if they are needed. These include: Canes. Walkers. Scooters. Crutches. Turn on the lights when you go into a dark area. Replace any light bulbs as soon as they burn out. Set up your furniture so you have a clear path. Avoid moving your furniture around. If any of your floors are uneven, fix them. If there are any pets around you, be aware of where they are. Review your medicines with your doctor. Some medicines can make you feel dizzy. This can increase your chance of falling. Ask your doctor what other things that you can do to help prevent falls. This information is not intended to replace advice given to you by your health care provider. Make sure you discuss any questions you have with your health care provider. Document Released: 05/27/2009 Document Revised: 01/06/2016 Document Reviewed: 09/04/2014 Elsevier Interactive Patient Education  2017 [employer].

## 2022-05-10 ENCOUNTER — Telehealth: Payer: Self-pay | Admitting: Family Medicine

## 2022-05-10 NOTE — Telephone Encounter (Signed)
Called patient and he stated he has a lot going on right now and will call back later to schedule.

## 2022-05-10 NOTE — Telephone Encounter (Signed)
-----   Message from Lesleigh Noe, MD sent at 05/10/2022  8:07 AM EDT ----- Regarding: Part 2 of physical This patient needs part 2 of his medicare wellness scheduled

## 2022-05-22 NOTE — Telephone Encounter (Signed)
Pt called back and scheduled TOC with Romilda Garret on 05/31/22

## 2022-05-31 ENCOUNTER — Encounter: Payer: Self-pay | Admitting: Nurse Practitioner

## 2022-05-31 ENCOUNTER — Ambulatory Visit (INDEPENDENT_AMBULATORY_CARE_PROVIDER_SITE_OTHER): Payer: Medicare Other | Admitting: Nurse Practitioner

## 2022-05-31 VITALS — BP 124/62 | HR 80 | Temp 97.1°F | Resp 12 | Ht 70.0 in | Wt 177.0 lb

## 2022-05-31 DIAGNOSIS — I700 Atherosclerosis of aorta: Secondary | ICD-10-CM | POA: Diagnosis not present

## 2022-05-31 DIAGNOSIS — I10 Essential (primary) hypertension: Secondary | ICD-10-CM

## 2022-05-31 DIAGNOSIS — L219 Seborrheic dermatitis, unspecified: Secondary | ICD-10-CM | POA: Diagnosis not present

## 2022-05-31 DIAGNOSIS — Z23 Encounter for immunization: Secondary | ICD-10-CM | POA: Diagnosis not present

## 2022-05-31 DIAGNOSIS — Z638 Other specified problems related to primary support group: Secondary | ICD-10-CM

## 2022-05-31 DIAGNOSIS — Z125 Encounter for screening for malignant neoplasm of prostate: Secondary | ICD-10-CM

## 2022-05-31 DIAGNOSIS — Z1211 Encounter for screening for malignant neoplasm of colon: Secondary | ICD-10-CM | POA: Diagnosis not present

## 2022-05-31 DIAGNOSIS — E782 Mixed hyperlipidemia: Secondary | ICD-10-CM

## 2022-05-31 DIAGNOSIS — Z0000 Encounter for general adult medical examination without abnormal findings: Secondary | ICD-10-CM

## 2022-05-31 MED ORDER — ATORVASTATIN CALCIUM 20 MG PO TABS: 20.0000 mg | ORAL_TABLET | Freq: Every evening | ORAL | 3 refills | Status: DC

## 2022-05-31 MED ORDER — BETAMETHASONE DIPROPIONATE AUG 0.05 % EX LOTN: TOPICAL_LOTION | Freq: Two times a day (BID) | CUTANEOUS | 0 refills | Status: DC

## 2022-05-31 NOTE — Assessment & Plan Note (Signed)
Discussed age-appropriate immunizations and screening exams.  Patient was given [place] at dismissal a preventative healthcare maintenance for age range.  Inclusive of anticipatory guidance.  Patient is having some caregiver strain we did talk about anticipatory guidance in regards to respite care versus long-term care placement.

## 2022-05-31 NOTE — Assessment & Plan Note (Signed)
Blood pressure within normal limit.  He has checked it at home consistently for approximately 1 month it was within normal limits.  Adequately controlled on diet and exercise.  No medication at this time

## 2022-05-31 NOTE — Assessment & Plan Note (Signed)
Patient is taking care of his spouse [name] who has advancing dementia.  Has fallen 4 times within this past year.  Most recently fractured her wrist approximately 3 weeks ago.  They are having home health come out and thinking about getting in-home aide.  We did discuss doing counseling or therapy for patient he politely declined.  Did discuss respite care versus long-term care placement.

## 2022-05-31 NOTE — Assessment & Plan Note (Signed)
We will use dexamethasone lotion as needed.  Refill sent today

## 2022-05-31 NOTE — Progress Notes (Signed)
Established Patient Office Visit  Subjective   Patient ID: Scott Brennan, male    DOB: Jul 17, 1946  Age: 76 y.o. MRN: [id number]  Chief Complaint  Patient presents with   Transfer of Care    HPI  HTN: states that he has been on meds in the past. Currently maintained on diet. States that he does check it at home. States 120-130s.  Aortic atherosclerosis: Currently maintained on atorvastatin tolerating it well.  HLD: tolerates medication well  for complete physical and follow up of chronic conditions.  Immunizations: -Tetanus:2020 -Influenza: up to date -Shingles: information given  -Pneumonia: utd  -HPV: aged out  Diet: [place]. Cereal and coffee in the am. Light lunch that he eats out. Dinner at home hit and miss. Coffee and tea at lunch and water Exercise: No regular exercise. Will do sit ups and light weights at home. Was walking but had to decrease as of late  Eye exam: Completes annually. [facility]  Dental exam: Completes semi-annually    Colonoscopy: Completed in 2018 aged out Lung Cancer Screening: NA  Dexa: NA  PSA: Due  Sleep: 10-6am. Feels rested. Does not snore  Advanced directive: Does have a living will. States that he has a POA for his wife       Review of Systems  Constitutional:  Negative for chills and fever.  Respiratory:  Negative for shortness of breath.   Cardiovascular:  Negative for chest pain.  Gastrointestinal:  Negative for abdominal pain, constipation, diarrhea, nausea and vomiting.       BM daily   Genitourinary:  Negative for dysuria and hematuria.       Nocturia x 1-3   Neurological:  Negative for tingling and headaches.  Psychiatric/Behavioral:  Negative for hallucinations and suicidal ideas.       Objective:     BP 124/62   Pulse 80   Temp (!) 97.1 F (36.2 C)   Resp 12   Ht '5\' 10"'$  (1.778 m)   Wt 177 lb (80.3 kg)   SpO2 95%   BMI 25.40 kg/m  BP Readings from Last 3 Encounters:  05/31/22 124/62   05/03/21 (!) 146/72  12/24/19 140/74   Wt Readings from Last 3 Encounters:  05/31/22 177 lb (80.3 kg)  05/09/22 172 lb (78 kg)  05/03/21 172 lb 2 oz (78.1 kg)      Physical Exam Vitals and nursing note reviewed.  Constitutional:      Appearance: Normal appearance.  HENT:     Right Ear: Ear canal and external ear normal.     Left Ear: Ear canal and external ear normal.     Mouth/Throat:     Mouth: Mucous membranes are moist.     Pharynx: Oropharynx is clear.  Eyes:     Extraocular Movements: Extraocular movements intact.     Pupils: Pupils are equal, round, and reactive to light.     Comments: Wears glasses  Cardiovascular:     Rate and Rhythm: Normal rate and regular rhythm.     Pulses: Normal pulses.     Heart sounds: Normal heart sounds.  Pulmonary:     Effort: Pulmonary effort is normal.     Breath sounds: Normal breath sounds.  Abdominal:     General: Bowel sounds are normal. There is no distension.     Palpations: There is no mass.     Tenderness: There is no abdominal tenderness.     Hernia: No hernia is present.  Musculoskeletal:  Right lower leg: Edema (R>L 1+) present.     Left lower leg: Edema (Trace) present.  Lymphadenopathy:     Cervical: No cervical adenopathy.  Neurological:     General: No focal deficit present.     Mental Status: He is alert.     Deep Tendon Reflexes:     Reflex Scores:      Bicep reflexes are 2+ on the right side and 2+ on the left side.      Patellar reflexes are 2+ on the right side and 2+ on the left side.    Comments: Bilateral upper and lower extremity strength 5/5  Psychiatric:        Attention and Perception: Attention normal.        Mood and Affect: Mood is depressed.        Speech: Speech normal.        Behavior: Behavior normal.        Thought Content: Thought content normal. Thought content does not include homicidal or suicidal ideation. Thought content does not include homicidal or suicidal plan.         Judgment: Judgment normal.      No results found for any visits on 05/31/22.    The 10-year ASCVD risk score (Arnett DK, et al., 2019) is: 21.6%    Assessment & Plan:   Problem List Items Addressed This Visit       Cardiovascular and Mediastinum   Essential hypertension    Blood pressure within normal limit.  He has checked it at home consistently for approximately 1 month it was within normal limits.  Adequately controlled on diet and exercise.  No medication at this time      Relevant Medications   atorvastatin (LIPITOR) 20 MG tablet   Other Relevant Orders   CBC   Comprehensive metabolic panel   TSH   Aortic atherosclerosis ([place])    Patient currently maintained on atorvastatin 20 mg.  He is exercising.  Continue atorvastatin as prescribed      Relevant Medications   atorvastatin (LIPITOR) 20 MG tablet     Musculoskeletal and Integument   Seborrheic dermatitis of scalp    We will use dexamethasone lotion as needed.  Refill sent today      Relevant Medications   betamethasone, augmented, (DIPROLENE) 0.05 % lotion     Other   Mixed hyperlipidemia    Patient currently maintained on atorvastatin 20 mg.  Tolerating medication well.  Pending labs today.  Continue medication as prescribed.      Relevant Medications   atorvastatin (LIPITOR) 20 MG tablet   Other Relevant Orders   Lipid panel   Preventative health care - Primary    Discussed age-appropriate immunizations and screening exams.  Patient was given [place] at dismissal a preventative healthcare maintenance for age range.  Inclusive of anticipatory guidance.  Patient is having some caregiver strain we did talk about anticipatory guidance in regards to respite care versus long-term care placement.      Relevant Orders   CBC   Comprehensive metabolic panel   Lipid panel   TSH   Caregiver role strain    Patient is taking care of his spouse [name] who has advancing dementia.  Has fallen 4 times within this  past year.  Most recently fractured her wrist approximately 3 weeks ago.  They are having home health come out and thinking about getting in-home aide.  We did discuss doing counseling or therapy for patient he politely  declined.  Did discuss respite care versus long-term care placement.      Other Visit Diagnoses     Need for immunization against influenza       Relevant Orders   Flu Vaccine QUAD High Dose(Fluad)       Return in about 1 year (around 06/01/2023) for CPE and labs.    Romilda Garret, NP

## 2022-05-31 NOTE — Patient Instructions (Signed)
Nice to see you today Refills sent to your pharmacy Follow up with me in 1 year, sooner if you need me

## 2022-05-31 NOTE — Assessment & Plan Note (Signed)
Patient currently maintained on atorvastatin 20 mg.  Tolerating medication well.  Pending labs today.  Continue medication as prescribed.

## 2022-05-31 NOTE — Assessment & Plan Note (Signed)
Patient currently maintained on atorvastatin 20 mg.  He is exercising.  Continue atorvastatin as prescribed

## 2022-06-01 LAB — COMPREHENSIVE METABOLIC PANEL
ALT: 23 U/L (ref 0–53)
AST: 30 U/L (ref 0–37)
Albumin: 3.9 g/dL (ref 3.5–5.2)
Alkaline Phosphatase: 71 U/L (ref 39–117)
BUN: 26 mg/dL — ABNORMAL HIGH (ref 6–23)
CO2: 32 mEq/L (ref 19–32)
Calcium: 9 mg/dL (ref 8.4–10.5)
Chloride: 102 mEq/L (ref 96–112)
Creatinine, Ser: 1.16 mg/dL (ref 0.40–1.50)
GFR: 61.14 mL/min (ref >60.00)
Glucose, Bld: 96 mg/dL (ref 70–99)
Potassium: 4.5 mEq/L (ref 3.5–5.1)
Sodium: 139 mEq/L (ref 135–145)
Total Bilirubin: 0.5 mg/dL (ref 0.2–1.2)
Total Protein: 6.3 g/dL (ref 6.0–8.3)

## 2022-06-01 LAB — LIPID PANEL
Cholesterol: 142 mg/dL (ref 0–200)
HDL: 64.9 mg/dL (ref >39.00)
LDL Cholesterol: 65 mg/dL (ref 0–99)
NonHDL: 77.19
Total CHOL/HDL Ratio: 2
Triglycerides: 63 mg/dL (ref 0.0–149.0)
VLDL: 12.6 mg/dL (ref 0.0–40.0)

## 2022-06-01 LAB — CBC
HCT: 35.7 % — ABNORMAL LOW (ref 39.0–52.0)
Hemoglobin: 12.1 g/dL — ABNORMAL LOW (ref 13.0–17.0)
MCHC: 33.9 g/dL (ref 30.0–36.0)
MCV: 89.9 fl (ref 78.0–100.0)
Platelets: 203 10*3/uL (ref 150.0–400.0)
RBC: 3.97 Mil/uL — ABNORMAL LOW (ref 4.22–5.81)
RDW: 14.3 % (ref 11.5–15.5)
WBC: 4.9 10*3/uL (ref 4.0–10.5)

## 2022-06-01 LAB — TSH: TSH: 1.62 u[IU]/mL (ref 0.35–5.50)

## 2022-06-01 LAB — PSA, MEDICARE: PSA: 1.93 ng/ml (ref 0.10–4.00)

## 2022-06-05 ENCOUNTER — Telehealth: Payer: Self-pay | Admitting: Nurse Practitioner

## 2022-06-05 DIAGNOSIS — D582 Other hemoglobinopathies: Secondary | ICD-10-CM

## 2022-06-05 NOTE — Telephone Encounter (Signed)
Order placed

## 2022-06-05 NOTE — Telephone Encounter (Signed)
-----   Message from [place] sent at 06/05/2022 11:09 AM EDT ----- Patient advised and viewed results on mychart. Patient has not had any blood in urine or stool. Patient agrees to IFOB test and will come by maybe today or tomorrow possibly to get this done.

## 2022-06-06 LAB — FECAL OCCULT BLOOD, IMMUNOCHEMICAL: Fecal Occult Bld: NEGATIVE

## 2022-06-06 NOTE — Addendum Note (Signed)
Addended by: Ellamae Sia on: 06/06/2022 12:13 PM   Modules accepted: Orders

## 2022-06-27 DIAGNOSIS — M546 Pain in thoracic spine: Secondary | ICD-10-CM | POA: Diagnosis not present

## 2022-06-27 DIAGNOSIS — M5412 Radiculopathy, cervical region: Secondary | ICD-10-CM | POA: Diagnosis not present

## 2022-06-27 DIAGNOSIS — M9902 Segmental and somatic dysfunction of thoracic region: Secondary | ICD-10-CM | POA: Diagnosis not present

## 2022-06-27 DIAGNOSIS — M9901 Segmental and somatic dysfunction of cervical region: Secondary | ICD-10-CM | POA: Diagnosis not present

## 2022-06-29 DIAGNOSIS — M9901 Segmental and somatic dysfunction of cervical region: Secondary | ICD-10-CM | POA: Diagnosis not present

## 2022-06-29 DIAGNOSIS — M5412 Radiculopathy, cervical region: Secondary | ICD-10-CM | POA: Diagnosis not present

## 2022-06-29 DIAGNOSIS — M546 Pain in thoracic spine: Secondary | ICD-10-CM | POA: Diagnosis not present

## 2022-06-29 DIAGNOSIS — M9902 Segmental and somatic dysfunction of thoracic region: Secondary | ICD-10-CM | POA: Diagnosis not present

## 2022-07-03 DIAGNOSIS — M9901 Segmental and somatic dysfunction of cervical region: Secondary | ICD-10-CM | POA: Diagnosis not present

## 2022-07-03 DIAGNOSIS — M9902 Segmental and somatic dysfunction of thoracic region: Secondary | ICD-10-CM | POA: Diagnosis not present

## 2022-07-03 DIAGNOSIS — M546 Pain in thoracic spine: Secondary | ICD-10-CM | POA: Diagnosis not present

## 2022-07-03 DIAGNOSIS — M5412 Radiculopathy, cervical region: Secondary | ICD-10-CM | POA: Diagnosis not present

## 2022-07-04 DIAGNOSIS — M9901 Segmental and somatic dysfunction of cervical region: Secondary | ICD-10-CM | POA: Diagnosis not present

## 2022-07-04 DIAGNOSIS — M5412 Radiculopathy, cervical region: Secondary | ICD-10-CM | POA: Diagnosis not present

## 2022-07-04 DIAGNOSIS — M546 Pain in thoracic spine: Secondary | ICD-10-CM | POA: Diagnosis not present

## 2022-07-04 DIAGNOSIS — M9902 Segmental and somatic dysfunction of thoracic region: Secondary | ICD-10-CM | POA: Diagnosis not present

## 2022-07-05 DIAGNOSIS — M5412 Radiculopathy, cervical region: Secondary | ICD-10-CM | POA: Diagnosis not present

## 2022-07-05 DIAGNOSIS — M9902 Segmental and somatic dysfunction of thoracic region: Secondary | ICD-10-CM | POA: Diagnosis not present

## 2022-07-05 DIAGNOSIS — M546 Pain in thoracic spine: Secondary | ICD-10-CM | POA: Diagnosis not present

## 2022-07-05 DIAGNOSIS — M9901 Segmental and somatic dysfunction of cervical region: Secondary | ICD-10-CM | POA: Diagnosis not present

## 2022-07-10 DIAGNOSIS — M9902 Segmental and somatic dysfunction of thoracic region: Secondary | ICD-10-CM | POA: Diagnosis not present

## 2022-07-10 DIAGNOSIS — M9901 Segmental and somatic dysfunction of cervical region: Secondary | ICD-10-CM | POA: Diagnosis not present

## 2022-07-10 DIAGNOSIS — M546 Pain in thoracic spine: Secondary | ICD-10-CM | POA: Diagnosis not present

## 2022-07-10 DIAGNOSIS — M5412 Radiculopathy, cervical region: Secondary | ICD-10-CM | POA: Diagnosis not present

## 2022-07-12 DIAGNOSIS — M5412 Radiculopathy, cervical region: Secondary | ICD-10-CM | POA: Diagnosis not present

## 2022-07-12 DIAGNOSIS — M546 Pain in thoracic spine: Secondary | ICD-10-CM | POA: Diagnosis not present

## 2022-07-12 DIAGNOSIS — M9901 Segmental and somatic dysfunction of cervical region: Secondary | ICD-10-CM | POA: Diagnosis not present

## 2022-07-12 DIAGNOSIS — M9902 Segmental and somatic dysfunction of thoracic region: Secondary | ICD-10-CM | POA: Diagnosis not present

## 2022-07-14 DIAGNOSIS — M5412 Radiculopathy, cervical region: Secondary | ICD-10-CM | POA: Diagnosis not present

## 2022-07-14 DIAGNOSIS — M546 Pain in thoracic spine: Secondary | ICD-10-CM | POA: Diagnosis not present

## 2022-07-14 DIAGNOSIS — M9901 Segmental and somatic dysfunction of cervical region: Secondary | ICD-10-CM | POA: Diagnosis not present

## 2022-07-14 DIAGNOSIS — M9902 Segmental and somatic dysfunction of thoracic region: Secondary | ICD-10-CM | POA: Diagnosis not present

## 2022-07-17 DIAGNOSIS — M5412 Radiculopathy, cervical region: Secondary | ICD-10-CM | POA: Diagnosis not present

## 2022-07-17 DIAGNOSIS — M9901 Segmental and somatic dysfunction of cervical region: Secondary | ICD-10-CM | POA: Diagnosis not present

## 2022-07-17 DIAGNOSIS — M9902 Segmental and somatic dysfunction of thoracic region: Secondary | ICD-10-CM | POA: Diagnosis not present

## 2022-07-17 DIAGNOSIS — M546 Pain in thoracic spine: Secondary | ICD-10-CM | POA: Diagnosis not present

## 2022-07-21 DIAGNOSIS — M5412 Radiculopathy, cervical region: Secondary | ICD-10-CM | POA: Diagnosis not present

## 2022-07-21 DIAGNOSIS — M546 Pain in thoracic spine: Secondary | ICD-10-CM | POA: Diagnosis not present

## 2022-07-21 DIAGNOSIS — M9902 Segmental and somatic dysfunction of thoracic region: Secondary | ICD-10-CM | POA: Diagnosis not present

## 2022-07-21 DIAGNOSIS — M9901 Segmental and somatic dysfunction of cervical region: Secondary | ICD-10-CM | POA: Diagnosis not present

## 2022-07-24 DIAGNOSIS — M9902 Segmental and somatic dysfunction of thoracic region: Secondary | ICD-10-CM | POA: Diagnosis not present

## 2022-07-24 DIAGNOSIS — M9901 Segmental and somatic dysfunction of cervical region: Secondary | ICD-10-CM | POA: Diagnosis not present

## 2022-07-24 DIAGNOSIS — M546 Pain in thoracic spine: Secondary | ICD-10-CM | POA: Diagnosis not present

## 2022-07-24 DIAGNOSIS — M5412 Radiculopathy, cervical region: Secondary | ICD-10-CM | POA: Diagnosis not present

## 2022-07-28 DIAGNOSIS — M546 Pain in thoracic spine: Secondary | ICD-10-CM | POA: Diagnosis not present

## 2022-07-28 DIAGNOSIS — M9901 Segmental and somatic dysfunction of cervical region: Secondary | ICD-10-CM | POA: Diagnosis not present

## 2022-07-28 DIAGNOSIS — M9902 Segmental and somatic dysfunction of thoracic region: Secondary | ICD-10-CM | POA: Diagnosis not present

## 2022-07-28 DIAGNOSIS — M5412 Radiculopathy, cervical region: Secondary | ICD-10-CM | POA: Diagnosis not present

## 2022-08-02 DIAGNOSIS — M5412 Radiculopathy, cervical region: Secondary | ICD-10-CM | POA: Diagnosis not present

## 2022-08-02 DIAGNOSIS — M9902 Segmental and somatic dysfunction of thoracic region: Secondary | ICD-10-CM | POA: Diagnosis not present

## 2022-08-02 DIAGNOSIS — M9901 Segmental and somatic dysfunction of cervical region: Secondary | ICD-10-CM | POA: Diagnosis not present

## 2022-08-02 DIAGNOSIS — M546 Pain in thoracic spine: Secondary | ICD-10-CM | POA: Diagnosis not present

## 2022-08-09 DIAGNOSIS — M9901 Segmental and somatic dysfunction of cervical region: Secondary | ICD-10-CM | POA: Diagnosis not present

## 2022-08-09 DIAGNOSIS — M5412 Radiculopathy, cervical region: Secondary | ICD-10-CM | POA: Diagnosis not present

## 2022-08-09 DIAGNOSIS — M9902 Segmental and somatic dysfunction of thoracic region: Secondary | ICD-10-CM | POA: Diagnosis not present

## 2022-08-09 DIAGNOSIS — M546 Pain in thoracic spine: Secondary | ICD-10-CM | POA: Diagnosis not present

## 2022-08-29 DIAGNOSIS — L820 Inflamed seborrheic keratosis: Secondary | ICD-10-CM | POA: Diagnosis not present

## 2022-08-29 DIAGNOSIS — L218 Other seborrheic dermatitis: Secondary | ICD-10-CM | POA: Diagnosis not present

## 2022-09-06 DIAGNOSIS — M9902 Segmental and somatic dysfunction of thoracic region: Secondary | ICD-10-CM | POA: Diagnosis not present

## 2022-09-06 DIAGNOSIS — M546 Pain in thoracic spine: Secondary | ICD-10-CM | POA: Diagnosis not present

## 2022-09-06 DIAGNOSIS — M5412 Radiculopathy, cervical region: Secondary | ICD-10-CM | POA: Diagnosis not present

## 2022-09-06 DIAGNOSIS — M9901 Segmental and somatic dysfunction of cervical region: Secondary | ICD-10-CM | POA: Diagnosis not present

## 2022-09-20 ENCOUNTER — Encounter: Payer: Self-pay | Admitting: Nurse Practitioner

## 2022-09-20 ENCOUNTER — Ambulatory Visit (INDEPENDENT_AMBULATORY_CARE_PROVIDER_SITE_OTHER): Payer: Medicare Other | Admitting: Nurse Practitioner

## 2022-09-20 VITALS — BP 150/82 | HR 50 | Temp 97.8°F | Resp 16 | Ht 70.0 in | Wt 183.5 lb

## 2022-09-20 DIAGNOSIS — R350 Frequency of micturition: Secondary | ICD-10-CM | POA: Diagnosis not present

## 2022-09-20 DIAGNOSIS — R3129 Other microscopic hematuria: Secondary | ICD-10-CM

## 2022-09-20 DIAGNOSIS — N400 Enlarged prostate without lower urinary tract symptoms: Secondary | ICD-10-CM | POA: Insufficient documentation

## 2022-09-20 DIAGNOSIS — R351 Nocturia: Secondary | ICD-10-CM | POA: Diagnosis not present

## 2022-09-20 LAB — POC URINALSYSI DIPSTICK (AUTOMATED)
Bilirubin, UA: NEGATIVE
Glucose, UA: NEGATIVE
Ketones, UA: NEGATIVE
Leukocytes, UA: NEGATIVE
Nitrite, UA: NEGATIVE
Protein, UA: NEGATIVE
Spec Grav, UA: 1.02 (ref 1.010–1.025)
Urobilinogen, UA: 0.2 E.U./dL — AB
pH, UA: 6 (ref 5.0–8.0)

## 2022-09-20 MED ORDER — TAMSULOSIN HCL 0.4 MG PO CAPS: 0.4000 mg | ORAL_CAPSULE | Freq: Every day | ORAL | 0 refills | Status: DC

## 2022-09-20 NOTE — Assessment & Plan Note (Signed)
Noted on dip send out for microscopy given patient is a former smoker.  Pending result

## 2022-09-20 NOTE — Assessment & Plan Note (Signed)
Noticed on exam.  Last PSA within normal limits.  Used to be on tamsulosin 0.4 mg.  Will restart.  Patient reach out in 30 days to let me know if medication is effective

## 2022-09-20 NOTE — Assessment & Plan Note (Signed)
UA in office. 

## 2022-09-20 NOTE — Patient Instructions (Signed)
Nice to see you today We will start the tamsulosin back. Reach out to me in 30 days and let me know if it is improving

## 2022-09-20 NOTE — Assessment & Plan Note (Signed)
Likely secondary to BPH.  Start tamsulosin 0.4 mg. Negative minus a trace of blood

## 2022-09-20 NOTE — Progress Notes (Signed)
Acute Office Visit  Subjective:     Patient ID: Scott Brennan, male    DOB: [birth year], 77 y.o.   MRN: [id number]  Chief Complaint  Patient presents with   Prostate Check    Up 5-6 times a night, starting and having to stop urinating, and feels like he is not emptying when he goes    HPI Patient is in today for nocturia with a history of HTN, AA, ED, and HLD  States that it has been going on for several months. States he is getting up 5-6 times at night.States that he feels like he has compete emptying. Weak stream with start and stopping. He has been on flomax in the past and came off since.  Back issues: states that he has bought 2 mattress and has been seeing a Field seismologist. States that in the morning it takes him 20 mins to get his back going. States sometimes he will hurt duringin the day. States that the adjustments help some. He has also done some at home exercises. Has been going to [facility] 3 times a week doing weights  Started last April first treatment was september    Review of Systems  Constitutional:  Negative for chills and fever.  Respiratory:  Negative for shortness of breath.   Cardiovascular:  Negative for chest pain.  Gastrointestinal:  Negative for abdominal pain, nausea and vomiting.  Genitourinary:  Positive for frequency. Negative for dysuria and urgency.        Objective:    BP (!) 150/82   Pulse (!) 50   Temp 97.8 F (36.6 C)   Resp 16   Ht '5\' 10"'$  (1.778 m)   Wt 183 lb 8 oz (83.2 kg)   SpO2 99%   BMI 26.33 kg/m  BP Readings from Last 3 Encounters:  09/20/22 (!) 150/82  05/31/22 124/62  05/03/21 (!) 146/72   Wt Readings from Last 3 Encounters:  09/20/22 183 lb 8 oz (83.2 kg)  05/31/22 177 lb (80.3 kg)  05/09/22 172 lb (78 kg)      Physical Exam Vitals and nursing note reviewed. Exam conducted with a chaperone present ([place]).  Constitutional:      Appearance: Normal appearance.  Cardiovascular:     Rate and Rhythm:  Normal rate and regular rhythm.     Heart sounds: Normal heart sounds.  Pulmonary:     Effort: Pulmonary effort is normal.     Breath sounds: Normal breath sounds.  Genitourinary:    Prostate: Enlarged and nodules present. Not tender.     Comments: Left sided prostate lobe noticeably large on palpation  Musculoskeletal:        General: No tenderness or signs of injury.     Lumbar back: No tenderness or bony tenderness. Negative right straight leg raise test and negative left straight leg raise test.       Back:  Neurological:     Mental Status: He is alert.     Results for orders placed or performed in visit on 09/20/22  POCT Urinalysis Dipstick (Automated)  Result Value Ref Range   Color, UA yellow    Clarity, UA clear    Glucose, UA Negative Negative   Bilirubin, UA Negative    Ketones, UA Negative    Spec Grav, UA 1.020 1.010 - 1.025   Blood, UA trace    pH, UA 6.0 5.0 - 8.0   Protein, UA Negative Negative   Urobilinogen, UA 0.2 (A) 0.2 or  1.0 E.U./dL   Nitrite, UA Negative    Leukocytes, UA Negative Negative        Assessment & Plan:   Problem List Items Addressed This Visit       Genitourinary   Microscopic hematuria    Noted on dip send out for microscopy given patient is a former smoker.  Pending result      Relevant Orders   Urine Microscopic     Other   Urinary frequency - Primary    UA in office      Relevant Orders   POCT Urinalysis Dipstick (Automated) (Completed)   Nocturia    Likely secondary to BPH.  Start tamsulosin 0.4 mg. Negative minus a trace of blood      Relevant Medications   tamsulosin (FLOMAX) 0.4 MG CAPS capsule   Enlarged prostate    Noticed on exam.  Last PSA within normal limits.  Used to be on tamsulosin 0.4 mg.  Will restart.  Patient reach out in 30 days to let me know if medication is effective      Relevant Medications   tamsulosin (FLOMAX) 0.4 MG CAPS capsule    Meds ordered this encounter  Medications    tamsulosin (FLOMAX) 0.4 MG CAPS capsule    Sig: Take 1 capsule (0.4 mg total) by mouth daily.    Dispense:  30 capsule    Refill:  0    Order Specific Question:   Supervising Provider    Answer:   TOWER, MARNE A [[id number]    Return if symptoms worsen or fail to improve.  Romilda Garret, NP

## 2022-09-21 LAB — URINALYSIS, MICROSCOPIC ONLY

## 2022-10-04 DIAGNOSIS — M546 Pain in thoracic spine: Secondary | ICD-10-CM | POA: Diagnosis not present

## 2022-10-04 DIAGNOSIS — M9901 Segmental and somatic dysfunction of cervical region: Secondary | ICD-10-CM | POA: Diagnosis not present

## 2022-10-04 DIAGNOSIS — M9902 Segmental and somatic dysfunction of thoracic region: Secondary | ICD-10-CM | POA: Diagnosis not present

## 2022-10-04 DIAGNOSIS — M5412 Radiculopathy, cervical region: Secondary | ICD-10-CM | POA: Diagnosis not present

## 2022-10-13 ENCOUNTER — Other Ambulatory Visit: Payer: Self-pay | Admitting: Nurse Practitioner

## 2022-10-13 DIAGNOSIS — R351 Nocturia: Secondary | ICD-10-CM

## 2022-10-13 DIAGNOSIS — N400 Enlarged prostate without lower urinary tract symptoms: Secondary | ICD-10-CM

## 2022-10-13 NOTE — Telephone Encounter (Signed)
Spoke to pt. He said he thinks it is working a good 90%. Would like to continue taking it.

## 2022-10-17 ENCOUNTER — Ambulatory Visit (INDEPENDENT_AMBULATORY_CARE_PROVIDER_SITE_OTHER): Payer: Medicare Other | Admitting: Nurse Practitioner

## 2022-10-17 ENCOUNTER — Encounter: Payer: Self-pay | Admitting: Nurse Practitioner

## 2022-10-17 VITALS — BP 136/70 | HR 95 | Temp 99.1°F | Resp 16 | Ht 70.0 in | Wt 183.4 lb

## 2022-10-17 DIAGNOSIS — G8929 Other chronic pain: Secondary | ICD-10-CM

## 2022-10-17 DIAGNOSIS — M5440 Lumbago with sciatica, unspecified side: Secondary | ICD-10-CM | POA: Diagnosis not present

## 2022-10-17 DIAGNOSIS — H60312 Diffuse otitis externa, left ear: Secondary | ICD-10-CM | POA: Diagnosis not present

## 2022-10-17 MED ORDER — CIPROFLOXACIN-DEXAMETHASONE 0.3-0.1 % OT SUSP: 4.0000 [drp] | Freq: Two times a day (BID) | OTIC | 0 refills | Status: AC

## 2022-10-17 NOTE — Progress Notes (Signed)
Acute Office Visit  Subjective:     Patient ID: Scott Brennan, male    DOB: 11/01/1945, 77 y.o.   MRN: [id number]  Chief Complaint  Patient presents with   Ear Pain    Left ear x over a year    HPI Patient is in today for ear pain with a history of HTN, Aortic atherosclerosis, and former smoker  Ear pain that is left side. States that he is followed by ENT. States that yesterday it was swollen to the point that his bite was off. States that the swelling is some better today. States that he did make an appointment but they cannot see him until April. Patient states he had a history of ear infections in the past.  States as of late he has not had his head submerged in water.  States he has used cerumen softening eardrops without great relief.  Per patient he feels like it is hard to get anything out when he gets his ear inclusive of water when he is bathing.  Review of Systems  Constitutional:  Negative for chills and fever.  HENT:  Positive for ear pain. Negative for ear discharge, sinus pain and sore throat.   Respiratory:  Negative for cough.         Objective:    BP 136/70   Pulse 95   Temp 99.1 F (37.3 C)   Resp 16   Ht '5\' 10"'$  (1.778 m)   Wt 183 lb 6 oz (83.2 kg)   SpO2 98%   BMI 26.31 kg/m    Physical Exam Vitals and nursing note reviewed.  Constitutional:      Appearance: Normal appearance.  HENT:     Right Ear: Ear canal and external ear normal.     Left Ear: Tympanic membrane normal. Drainage, swelling and tenderness present. Tympanic membrane is not perforated or erythematous.  Cardiovascular:     Rate and Rhythm: Normal rate and regular rhythm.     Heart sounds: Normal heart sounds.  Pulmonary:     Effort: Pulmonary effort is normal.     Breath sounds: Normal breath sounds.  Neurological:     Mental Status: He is alert.     No results found for any visits on 10/17/22.      Assessment & Plan:   Problem List Items Addressed This Visit        Nervous and Auditory   Chronic left-sided low back pain with sciatica    Patient has mentioned his low back pain in the past.  Is been going on for extended period of time he is seeing a chiropractor and tried several different mattresses without good relief.  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  Ambulatory referral placed today      Relevant Orders   Ambulatory referral to Orthopedic Surgery   Acute diffuse otitis externa of left ear - Primary    Otitis externa with ear canal swelling.  Will prescribe Ciprodex drops.  Patient will let me know if this is too expensive.  Follow-up with ENT as scheduled or if no improvement.      Relevant Medications   ciprofloxacin-dexamethasone (CIPRODEX) OTIC suspension    Meds ordered this encounter  Medications   ciprofloxacin-dexamethasone (CIPRODEX) OTIC suspension    Sig: Place 4 drops into the left ear 2 (two) times daily.    Dispense:  7.5 mL  Refill:  0    QS    Order Specific Question:   Supervising Provider    Answer:   Loura Pardon A [[id number]]    Return in about 7 months (around 06/02/2023) for CPE and Labs.  Romilda Garret, NP

## 2022-10-17 NOTE — Assessment & Plan Note (Signed)
Patient has mentioned his low back pain in the past.  Is been going on for extended period of time he is seeing a chiropractor and tried several different mattresses without good relief.  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  Ambulatory referral placed today

## 2022-10-17 NOTE — Assessment & Plan Note (Signed)
Otitis externa with ear canal swelling.  Will prescribe Ciprodex drops.  Patient will let me know if this is too expensive.  Follow-up with ENT as scheduled or if no improvement.

## 2022-10-17 NOTE — Patient Instructions (Signed)
Nice to see you today I have sent in ear drops to the pharmacy if they are expensive call and let me know I have also referred you to orthopedist, they will reach out to you within 2 weeks. If you have not heard from them let me know Follow up with me in

## 2022-11-08 DIAGNOSIS — M5412 Radiculopathy, cervical region: Secondary | ICD-10-CM | POA: Diagnosis not present

## 2022-11-08 DIAGNOSIS — M546 Pain in thoracic spine: Secondary | ICD-10-CM | POA: Diagnosis not present

## 2022-11-08 DIAGNOSIS — M9902 Segmental and somatic dysfunction of thoracic region: Secondary | ICD-10-CM | POA: Diagnosis not present

## 2022-11-08 DIAGNOSIS — M9901 Segmental and somatic dysfunction of cervical region: Secondary | ICD-10-CM | POA: Diagnosis not present

## 2022-11-29 DIAGNOSIS — M546 Pain in thoracic spine: Secondary | ICD-10-CM | POA: Diagnosis not present

## 2022-11-29 DIAGNOSIS — M5412 Radiculopathy, cervical region: Secondary | ICD-10-CM | POA: Diagnosis not present

## 2022-11-29 DIAGNOSIS — M9902 Segmental and somatic dysfunction of thoracic region: Secondary | ICD-10-CM | POA: Diagnosis not present

## 2022-11-29 DIAGNOSIS — M9901 Segmental and somatic dysfunction of cervical region: Secondary | ICD-10-CM | POA: Diagnosis not present

## 2022-12-27 DIAGNOSIS — M9901 Segmental and somatic dysfunction of cervical region: Secondary | ICD-10-CM | POA: Diagnosis not present

## 2022-12-27 DIAGNOSIS — M546 Pain in thoracic spine: Secondary | ICD-10-CM | POA: Diagnosis not present

## 2022-12-27 DIAGNOSIS — M9902 Segmental and somatic dysfunction of thoracic region: Secondary | ICD-10-CM | POA: Diagnosis not present

## 2022-12-27 DIAGNOSIS — M5412 Radiculopathy, cervical region: Secondary | ICD-10-CM | POA: Diagnosis not present

## 2023-01-01 DIAGNOSIS — M5412 Radiculopathy, cervical region: Secondary | ICD-10-CM | POA: Diagnosis not present

## 2023-01-01 DIAGNOSIS — M9902 Segmental and somatic dysfunction of thoracic region: Secondary | ICD-10-CM | POA: Diagnosis not present

## 2023-01-01 DIAGNOSIS — M546 Pain in thoracic spine: Secondary | ICD-10-CM | POA: Diagnosis not present

## 2023-01-01 DIAGNOSIS — M9901 Segmental and somatic dysfunction of cervical region: Secondary | ICD-10-CM | POA: Diagnosis not present

## 2023-01-10 DIAGNOSIS — M9901 Segmental and somatic dysfunction of cervical region: Secondary | ICD-10-CM | POA: Diagnosis not present

## 2023-01-10 DIAGNOSIS — M546 Pain in thoracic spine: Secondary | ICD-10-CM | POA: Diagnosis not present

## 2023-01-10 DIAGNOSIS — M5412 Radiculopathy, cervical region: Secondary | ICD-10-CM | POA: Diagnosis not present

## 2023-01-10 DIAGNOSIS — M9902 Segmental and somatic dysfunction of thoracic region: Secondary | ICD-10-CM | POA: Diagnosis not present

## 2023-02-26 DIAGNOSIS — L820 Inflamed seborrheic keratosis: Secondary | ICD-10-CM | POA: Diagnosis not present

## 2023-02-26 DIAGNOSIS — L218 Other seborrheic dermatitis: Secondary | ICD-10-CM | POA: Diagnosis not present

## 2023-03-19 ENCOUNTER — Encounter: Payer: Self-pay | Admitting: Internal Medicine

## 2023-03-19 ENCOUNTER — Ambulatory Visit (INDEPENDENT_AMBULATORY_CARE_PROVIDER_SITE_OTHER): Payer: Medicare Other | Admitting: Internal Medicine

## 2023-03-19 VITALS — BP 138/80 | HR 43 | Temp 98.0°F | Ht 70.0 in | Wt 176.0 lb

## 2023-03-19 DIAGNOSIS — U071 COVID-19: Secondary | ICD-10-CM | POA: Diagnosis not present

## 2023-03-19 DIAGNOSIS — R051 Acute cough: Secondary | ICD-10-CM

## 2023-03-19 LAB — POC COVID19 BINAXNOW: SARS Coronavirus 2 Ag: POSITIVE — AB
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
## 2023-03-19 NOTE — Assessment & Plan Note (Signed)
Fairly mild and actually feels some better today Discussed antivirals--will hold off unless he worsens tomorrow ER if any sig SOB Continue analgesics Isolate at least till this weekend (5 days), then mask for a week

## 2023-03-29 ENCOUNTER — Encounter (INDEPENDENT_AMBULATORY_CARE_PROVIDER_SITE_OTHER): Payer: Self-pay

## 2023-03-29 ENCOUNTER — Other Ambulatory Visit: Payer: Self-pay | Admitting: Nurse Practitioner

## 2023-03-29 DIAGNOSIS — E782 Mixed hyperlipidemia: Secondary | ICD-10-CM

## 2023-03-30 NOTE — Telephone Encounter (Signed)
Can we schedule patient a CPE with me on 06/02/2023 or a little after. any 20 min slot is fine

## 2023-03-30 NOTE — Telephone Encounter (Signed)
LVM for patient to call back and schedule

## 2023-04-05 DIAGNOSIS — D3132 Benign neoplasm of left choroid: Secondary | ICD-10-CM | POA: Diagnosis not present

## 2023-04-05 DIAGNOSIS — H2513 Age-related nuclear cataract, bilateral: Secondary | ICD-10-CM | POA: Diagnosis not present

## 2023-04-05 DIAGNOSIS — H5203 Hypermetropia, bilateral: Secondary | ICD-10-CM | POA: Diagnosis not present

## 2023-04-05 DIAGNOSIS — H524 Presbyopia: Secondary | ICD-10-CM | POA: Diagnosis not present

## 2023-04-05 DIAGNOSIS — H353131 Nonexudative age-related macular degeneration, bilateral, early dry stage: Secondary | ICD-10-CM | POA: Diagnosis not present

## 2023-04-05 DIAGNOSIS — H52223 Regular astigmatism, bilateral: Secondary | ICD-10-CM | POA: Diagnosis not present

## 2023-04-06 ENCOUNTER — Other Ambulatory Visit: Payer: Self-pay | Admitting: Nurse Practitioner

## 2023-04-06 DIAGNOSIS — R351 Nocturia: Secondary | ICD-10-CM

## 2023-04-06 DIAGNOSIS — N400 Enlarged prostate without lower urinary tract symptoms: Secondary | ICD-10-CM

## 2023-04-13 ENCOUNTER — Encounter: Payer: Self-pay | Admitting: Nurse Practitioner

## 2023-04-13 ENCOUNTER — Ambulatory Visit: Payer: Medicare Other | Admitting: Nurse Practitioner

## 2023-04-13 VITALS — BP 140/60 | HR 53 | Temp 97.6°F | Ht 70.0 in | Wt 178.6 lb

## 2023-04-13 DIAGNOSIS — N529 Male erectile dysfunction, unspecified: Secondary | ICD-10-CM | POA: Diagnosis not present

## 2023-04-13 MED ORDER — TADALAFIL 10 MG PO TABS: 10.0000 mg | ORAL_TABLET | ORAL | 1 refills | Status: DC | PRN

## 2023-04-13 NOTE — Assessment & Plan Note (Signed)
History of the same.  Has tried sildenafil in the past with relief.  Patient would like to try sildenafil will start sildenafil 10 mg as needed.  Patient will follow-up if ineffective or reach out to the office

## 2023-04-13 NOTE — Progress Notes (Signed)
   Acute Office Visit  Subjective:     Patient ID: Scott Brennan, male    DOB: [birth year], 77 y.o.   MRN: [id number]  Chief Complaint  Patient presents with   discuss medications    HPI Patient is in today for ED   Staets that he does have trouble getting and maintaining erections. States he has tired vigaria in the past.  Currently the medication work he is only adverse drug event was congested nose.  He has read online about the longer acting Cialis and was curious about that.  Does mention as of late his wife was placed into a memory care unit.  He does still see her daily to feed her lunch. Review of Systems  Constitutional:  Negative for chills and fever.  Respiratory:  Negative for shortness of breath.   Cardiovascular:  Negative for chest pain.  Neurological:  Negative for headaches.        Objective:    BP (!) 140/60   Pulse (!) 53   Temp 97.6 F (36.4 C) (Temporal)   Ht 5\' 10"  (1.778 m)   Wt 178 lb 9.6 oz (81 kg)   SpO2 96%   BMI 25.63 kg/m  BP Readings from Last 3 Encounters:  04/13/23 (!) 140/60  03/19/23 138/80  10/17/22 136/70   Wt Readings from Last 3 Encounters:  04/13/23 178 lb 9.6 oz (81 kg)  03/19/23 176 lb (79.8 kg)  10/17/22 183 lb 6 oz (83.2 kg)   SpO2 Readings from Last 3 Encounters:  04/13/23 96%  03/19/23 97%  10/17/22 98%      Physical Exam Vitals and nursing note reviewed.  Constitutional:      Appearance: Normal appearance.  Cardiovascular:     Rate and Rhythm: Normal rate and regular rhythm.     Heart sounds: Normal heart sounds.  Pulmonary:     Effort: Pulmonary effort is normal.     Breath sounds: Normal breath sounds.  Neurological:     Mental Status: He is alert.     No results found for any visits on 04/13/23.      Assessment & Plan:   Problem List Items Addressed This Visit       Other   Erectile dysfunction - Primary    History of the same.  Has tried sildenafil in the past with relief.  Patient would  like to try sildenafil will start sildenafil 10 mg as needed.  Patient will follow-up if ineffective or reach out to the office      Relevant Medications   tadalafil (CIALIS) 10 MG tablet    Meds ordered this encounter  Medications   tadalafil (CIALIS) 10 MG tablet    Sig: Take 1 tablet (10 mg total) by mouth every other day as needed for erectile dysfunction.    Dispense:  10 tablet    Refill:  1    Order Specific Question:   Supervising Provider    Answer:   Roxy Manns A [[id number]]    Return in about 8 weeks (around 06/08/2023) for CPE and Labs.  Audria Nine, NP

## 2023-04-13 NOTE — Patient Instructions (Signed)
Nice to see you today I want to see you in 2 months for your physical and labs

## 2023-07-04 ENCOUNTER — Ambulatory Visit: Payer: Medicare Other

## 2023-07-04 VITALS — Ht 70.0 in | Wt 178.0 lb

## 2023-07-04 DIAGNOSIS — Z0000 Encounter for general adult medical examination without abnormal findings: Secondary | ICD-10-CM | POA: Diagnosis not present

## 2023-07-04 NOTE — Patient Instructions (Signed)
[name] , Thank you for taking time to come for your Medicare Wellness Visit. I appreciate your ongoing commitment to your health goals. Please review the following plan we discussed and let me know if I can assist you in the future.   Referrals/Orders/Follow-Ups/Clinician Recommendations: none  This is a list of the screening recommended for you and due dates:  Health Maintenance  Topic Date Due   Zoster (Shingles) Vaccine (1 of 2) Never done   COVID-19 Vaccine (5 - 2023-24 season) 04/15/2023   Pneumonia Vaccine (3 of 3 - PPSV23 or PCV20) 08/20/2023   Flu Shot  11/12/2023*   Medicare Annual Wellness Visit  07/03/2024   DTaP/Tdap/Td vaccine (3 - Tdap) 08/19/2028   Hepatitis C Screening  Completed   HPV Vaccine  Aged Out   Colon Cancer Screening  Discontinued  *Topic was postponed. The date shown is not the original due date.    Advanced directives: (In Chart) A copy of your advanced directives are scanned into your chart should your provider ever need it.  Next Medicare Annual Wellness Visit scheduled for next year: Yes  07/04/2024 @ 3:40pm telephone
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
## 2023-07-05 ENCOUNTER — Encounter: Payer: Self-pay | Admitting: Nurse Practitioner

## 2023-07-05 ENCOUNTER — Ambulatory Visit: Payer: Medicare Other | Admitting: Nurse Practitioner

## 2023-07-05 VITALS — BP 146/80 | Temp 97.6°F | Ht 70.0 in | Wt 187.2 lb

## 2023-07-05 DIAGNOSIS — Z87891 Personal history of nicotine dependence: Secondary | ICD-10-CM | POA: Diagnosis not present

## 2023-07-05 DIAGNOSIS — Z0000 Encounter for general adult medical examination without abnormal findings: Secondary | ICD-10-CM | POA: Diagnosis not present

## 2023-07-05 DIAGNOSIS — Z125 Encounter for screening for malignant neoplasm of prostate: Secondary | ICD-10-CM

## 2023-07-05 DIAGNOSIS — I499 Cardiac arrhythmia, unspecified: Secondary | ICD-10-CM | POA: Diagnosis not present

## 2023-07-05 DIAGNOSIS — I10 Essential (primary) hypertension: Secondary | ICD-10-CM

## 2023-07-05 DIAGNOSIS — M25521 Pain in right elbow: Secondary | ICD-10-CM | POA: Diagnosis not present

## 2023-07-05 DIAGNOSIS — Z0001 Encounter for general adult medical examination with abnormal findings: Secondary | ICD-10-CM

## 2023-07-05 DIAGNOSIS — I700 Atherosclerosis of aorta: Secondary | ICD-10-CM

## 2023-07-05 DIAGNOSIS — N529 Male erectile dysfunction, unspecified: Secondary | ICD-10-CM

## 2023-07-05 LAB — COMPREHENSIVE METABOLIC PANEL
ALT: 22 U/L (ref 0–53)
AST: 29 U/L (ref 0–37)
Albumin: 4 g/dL (ref 3.5–5.2)
Alkaline Phosphatase: 78 U/L (ref 39–117)
BUN: 27 mg/dL — ABNORMAL HIGH (ref 6–23)
CO2: 31 meq/L (ref 19–32)
Calcium: 9.3 mg/dL (ref 8.4–10.5)
Chloride: 103 meq/L (ref 96–112)
Creatinine, Ser: 1.1 mg/dL (ref 0.40–1.50)
GFR: 64.66 mL/min (ref >60.00)
Glucose, Bld: 83 mg/dL (ref 70–99)
Potassium: 4.5 meq/L (ref 3.5–5.1)
Sodium: 140 meq/L (ref 135–145)
Total Bilirubin: 0.6 mg/dL (ref 0.2–1.2)
Total Protein: 6.5 g/dL (ref 6.0–8.3)

## 2023-07-05 LAB — CBC
HCT: 40.6 % (ref 39.0–52.0)
Hemoglobin: 13.3 g/dL (ref 13.0–17.0)
MCHC: 32.7 g/dL (ref 30.0–36.0)
MCV: 92.8 fL (ref 78.0–100.0)
Platelets: 223 10*3/uL (ref 150.0–400.0)
RBC: 4.37 Mil/uL (ref 4.22–5.81)
RDW: 14 % (ref 11.5–15.5)
WBC: 4.2 10*3/uL (ref 4.0–10.5)

## 2023-07-05 LAB — LIPID PANEL
Cholesterol: 163 mg/dL (ref 0–200)
HDL: 65.2 mg/dL (ref >39.00)
LDL Cholesterol: 87 mg/dL (ref 0–99)
NonHDL: 97.33
Total CHOL/HDL Ratio: 2
Triglycerides: 54 mg/dL (ref 0.0–149.0)
VLDL: 10.8 mg/dL (ref 0.0–40.0)

## 2023-07-05 LAB — PSA, MEDICARE: PSA: 2 ng/mL (ref 0.10–4.00)

## 2023-07-05 LAB — TSH: TSH: 1.47 u[IU]/mL (ref 0.35–5.50)

## 2023-07-05 LAB — URINALYSIS, MICROSCOPIC ONLY

## 2023-07-05 MED ORDER — MELOXICAM 15 MG PO TABS: 15.0000 mg | ORAL_TABLET | Freq: Every day | ORAL | 0 refills | Status: DC

## 2023-07-05 MED ORDER — SILDENAFIL CITRATE 100 MG PO TABS: 50.0000 mg | ORAL_TABLET | Freq: Every day | ORAL | 3 refills | Status: DC | PRN

## 2023-07-05 NOTE — Assessment & Plan Note (Signed)
Likely an overuse injury from working out.  Will do meloxicam 15 mg with food daily for 5 to 7 days.  After that use it as needed.  Patient can rest and use offloading elbow band necessary.

## 2023-07-05 NOTE — Assessment & Plan Note (Signed)
History of the same.  Check urine micro to be rule out microscopic hematuria.

## 2023-07-05 NOTE — Assessment & Plan Note (Signed)
Discussed age-appropriate immunizations and screening exams.  Did review patient's personal, surgical, social, family histories.  Patient is up-to-date on all age-appropriate vaccinations he would like.  Patient is up-to-date on CRC screening.  Will do PSA for prostate cancer screening.  Patient was given information at discharge about preventative healthcare maintenance with anticipatory guidance.

## 2023-07-05 NOTE — Patient Instructions (Addendum)
Nice to see you today I will be I touch with the labs once I have them Follow up with me in 1 year, sooner if you need me   Consider the shingles vaccine at the local pharmacy

## 2023-07-05 NOTE — Progress Notes (Signed)
Established Patient Office Visit  Subjective   Patient ID: Scott Brennan, male    DOB: [birth year]  Age: 77 y.o. MRN: [id number]  Chief Complaint  Patient presents with   Annual Exam    HPI   HTN: state that when he gives blood pressure was 120/70.  States when he checks it outside the office is always within normal limits  HLD: atorvastatin 20mg  tolerates medication well.  ED: states that he is doing ok with the cilais. States that it does not give a full erction and is intermittent   BPH: on flomax for nocutira and BPH. States that he will still have start and stop   for complete physical and follow up of chronic conditions.  Immunizations: -Tetanus: Completed in 2020 -Influenza: utd  -Shingles: get at local pharmacy  -Pneumonia: Completed 2020  Diet: Fair diet. 2 meals a day. He will snack sometimes through the day. He will do tea and some water and coffee  Exercise: [employer] with machines or free weiht 1 hour 5 days a week   Eye exam: Completes annually. Glasses   Dental exam: Completes semi-annually    Colonoscopy: Completed in 03/06/2017, no repeat due to clean colonoscopy and patient is Lung Cancer Screening: N/A  PSA: Due  Sleep: goes to bed 930-10 and gets up around 530. Feels rested sometimes. He does snore   Advanced directive: living will   Elbow: right side has been hurting for approximately a month.  Patient does lift weights 5 days a week.  He was told it might be "tennis elbow".  Patient states his friends will begin elbow brace.  No injury per patient report      Review of Systems  Constitutional:  Negative for chills and fever.  Respiratory:  Negative for shortness of breath.   Cardiovascular:  Negative for chest pain and leg swelling.  Gastrointestinal:  Negative for abdominal pain, blood in stool, constipation, diarrhea, nausea and vomiting.       BM daily   Genitourinary:  Negative for dysuria and hematuria.  Neurological:  Negative for  tingling and headaches.  Psychiatric/Behavioral:  Negative for hallucinations and suicidal ideas.       Objective:     BP (!) 146/80   Temp 97.6 F (36.4 C) (Oral)   Ht 5\' 10"  (1.778 m)   Wt 187 lb 3.2 oz (84.9 kg)   SpO2 98%   BMI 26.86 kg/m    Physical Exam Vitals and nursing note reviewed.  Constitutional:      Appearance: Normal appearance.  HENT:     Right Ear: Tympanic membrane, ear canal and external ear normal.     Left Ear: Tympanic membrane, ear canal and external ear normal.     Mouth/Throat:     Mouth: Mucous membranes are moist.     Pharynx: Oropharynx is clear.  Eyes:     Extraocular Movements: Extraocular movements intact.     Pupils: Pupils are equal, round, and reactive to light.  Cardiovascular:     Rate and Rhythm: Bradycardia present. Rhythm irregular.     Pulses: Normal pulses.     Heart sounds: Normal heart sounds.  Pulmonary:     Effort: Pulmonary effort is normal.     Breath sounds: Normal breath sounds.  Abdominal:     General: Bowel sounds are normal. There is no distension.     Palpations: There is no mass.     Tenderness: There is no abdominal tenderness.  Hernia: No hernia is present.  Musculoskeletal:       Arms:     Right lower leg: No edema.     Left lower leg: No edema.  Lymphadenopathy:     Cervical: No cervical adenopathy.  Skin:    General: Skin is warm.  Neurological:     General: No focal deficit present.     Mental Status: He is alert.     Deep Tendon Reflexes:     Reflex Scores:      Bicep reflexes are 2+ on the right side and 2+ on the left side.      Patellar reflexes are 2+ on the right side and 2+ on the left side.    Comments: Bilateral upper and lower extremity strength 5/5  Psychiatric:        Mood and Affect: Mood normal.        Behavior: Behavior normal.        Thought Content: Thought content normal.        Judgment: Judgment normal.      No results found for any visits on 07/05/23.    The  10-year ASCVD risk score (Arnett DK, et al., 2019) is: 29.1%    Assessment & Plan:   Problem List Items Addressed This Visit       Cardiovascular and Mediastinum   Essential hypertension    This is currently maintained on lifestyle modifications only.  Continue      Relevant Medications   sildenafil (VIAGRA) 100 MG tablet   Aortic atherosclerosis (HCC)    Patient currently maintained on atorvastatin 20 mg daily.  Pending lipid panel today.  Continue medication as prescribed      Relevant Medications   sildenafil (VIAGRA) 100 MG tablet   Other Relevant Orders   Lipid panel     Other   ED (erectile dysfunction)    History of same has tried sildenafil and tadalafil in the past.  States he felt to get better effect with sildenafil.  Will switch patient from tadalafil to sildenafil.  100 mg tablets start with 50 mg.      Relevant Medications   sildenafil (VIAGRA) 100 MG tablet   Former smoker    History of the same.  Check urine micro to be rule out microscopic hematuria.      Relevant Orders   Urine Microscopic   Preventative health care - Primary    Discussed age-appropriate immunizations and screening exams.  Did review patient's personal, surgical, social, family histories.  Patient is up-to-date on all age-appropriate vaccinations he would like.  Patient is up-to-date on CRC screening.  Will do PSA for prostate cancer screening.  Patient was given information at discharge about preventative healthcare maintenance with anticipatory guidance.      Irregular heartbeat    EKG in office showed sinus  bradycardia with PACs.  Patient asymptomatic      Relevant Orders   EKG 12-Lead (Completed)   Right elbow pain    Likely an overuse injury from working out.  Will do meloxicam 15 mg with food daily for 5 to 7 days.  After that use it as needed.  Patient can rest and use offloading elbow band necessary.      Relevant Medications   meloxicam (MOBIC) 15 MG tablet   Other  Visit Diagnoses     Screening for prostate cancer       Relevant Orders   PSA, Medicare       Return in  about 1 year (around 07/04/2024) for CPE and Labs.    Audria Nine, NP

## 2023-07-05 NOTE — Assessment & Plan Note (Signed)
Patient currently maintained on atorvastatin 20 mg daily.  Pending lipid panel today.  Continue medication as prescribed

## 2023-07-05 NOTE — Assessment & Plan Note (Signed)
This is currently maintained on lifestyle modifications only.  Continue

## 2023-07-05 NOTE — Assessment & Plan Note (Signed)
EKG in office showed sinus  bradycardia with PACs.  Patient asymptomatic

## 2023-07-05 NOTE — Assessment & Plan Note (Signed)
History of same has tried sildenafil and tadalafil in the past.  States he felt to get better effect with sildenafil.  Will switch patient from tadalafil to sildenafil.  100 mg tablets start with 50 mg.

## 2023-08-27 ENCOUNTER — Other Ambulatory Visit: Payer: Self-pay | Admitting: Nurse Practitioner

## 2023-08-27 DIAGNOSIS — M25521 Pain in right elbow: Secondary | ICD-10-CM

## 2023-08-30 ENCOUNTER — Other Ambulatory Visit: Payer: Self-pay | Admitting: Nurse Practitioner

## 2023-08-30 DIAGNOSIS — H60312 Diffuse otitis externa, left ear: Secondary | ICD-10-CM

## 2023-09-15 ENCOUNTER — Other Ambulatory Visit: Payer: Self-pay | Admitting: Nurse Practitioner

## 2023-09-15 DIAGNOSIS — E782 Mixed hyperlipidemia: Secondary | ICD-10-CM

## 2023-10-07 ENCOUNTER — Other Ambulatory Visit: Payer: Self-pay | Admitting: Nurse Practitioner

## 2023-10-07 DIAGNOSIS — N400 Enlarged prostate without lower urinary tract symptoms: Secondary | ICD-10-CM

## 2023-10-07 DIAGNOSIS — R351 Nocturia: Secondary | ICD-10-CM

## 2024-02-22 DIAGNOSIS — H524 Presbyopia: Secondary | ICD-10-CM | POA: Diagnosis not present

## 2024-02-22 DIAGNOSIS — H52223 Regular astigmatism, bilateral: Secondary | ICD-10-CM | POA: Diagnosis not present

## 2024-02-22 DIAGNOSIS — H2513 Age-related nuclear cataract, bilateral: Secondary | ICD-10-CM | POA: Diagnosis not present

## 2024-02-22 DIAGNOSIS — H5203 Hypermetropia, bilateral: Secondary | ICD-10-CM | POA: Diagnosis not present

## 2024-02-22 DIAGNOSIS — D3132 Benign neoplasm of left choroid: Secondary | ICD-10-CM | POA: Diagnosis not present

## 2024-02-22 DIAGNOSIS — H353131 Nonexudative age-related macular degeneration, bilateral, early dry stage: Secondary | ICD-10-CM | POA: Diagnosis not present

## 2024-04-01 ENCOUNTER — Other Ambulatory Visit: Payer: Self-pay | Admitting: Nurse Practitioner

## 2024-04-01 DIAGNOSIS — R351 Nocturia: Secondary | ICD-10-CM

## 2024-04-01 DIAGNOSIS — N400 Enlarged prostate without lower urinary tract symptoms: Secondary | ICD-10-CM

## 2024-04-29 ENCOUNTER — Ambulatory Visit: Payer: Self-pay

## 2024-04-29 NOTE — Telephone Encounter (Signed)
 FYI Only or Action Required?: FYI only for provider.  Patient was last seen in primary care on 07/05/2023 by Wendee Lynwood HERO, NP.  Called Nurse Triage reporting Foreign Body in Skin.  Symptoms began yesterday.  Interventions attempted: Nothing.  Symptoms are: stable.  Triage Disposition: See Physician Within 24 Hours  Patient/caregiver understands and will follow disposition?: Yes      Copied from CRM #[id number]. Topic: Clinical - Red Word Triage >> Apr 29, 2024  9:22 AM Donna BRAVO wrote: Red Word that prompted transfer to Nurse Triage: Patient calling in stating he has a splinter in the right heel, hurts to walk on it, not 1/4 inch long. >> Apr 29, 2024  9:23 AM Donna E wrote: Patient did not want to see another provider on 04/30/24 Reason for Disposition  [1] Caller can't get FB out AND [2] causing no pain  (Exception: Tiny, superficial, pain-free FBs; since these don't need to be removed.)  Answer Assessment - Initial Assessment Questions 1. MECHANISM: How did it happen?      Was barefoot on deck -- splinter broke off about 1/4 inch 2. LOCATION: Where is the FB (e.g., splinter) located?      R heal 3. OBJECT: What type of FB was it?      wood 4. DEPTH: How deep do you think the FB goes?      unknown 5. ONSET: When did the injury occur? (Minutes or hours)      yesterday 6. PAIN: Is it painful? If Yes, ask: How bad is the pain?  (Scale 1-10; or mild, moderate, severe)     Painful to touch only 7. TETANUS: When was your last tetanus booster?     *No Answer* 8. PREGNANCY: Is there any chance you are pregnant? When was your last menstrual period?     N/a  Protocols used: Skin Foreign Body-A-AH

## 2024-04-29 NOTE — Telephone Encounter (Signed)
 Noted, will see patient.

## 2024-04-29 NOTE — Telephone Encounter (Signed)
 Noted. Appreciate Dr. Avelina seeing him

## 2024-04-30 ENCOUNTER — Ambulatory Visit: Admitting: Family Medicine

## 2024-04-30 ENCOUNTER — Encounter: Payer: Self-pay | Admitting: Family Medicine

## 2024-04-30 VITALS — BP 138/84 | HR 54 | Temp 97.7°F | Ht 70.0 in | Wt 186.6 lb

## 2024-04-30 DIAGNOSIS — S91341A Puncture wound with foreign body, right foot, initial encounter: Secondary | ICD-10-CM

## 2024-04-30 DIAGNOSIS — T148XXA Other injury of unspecified body region, initial encounter: Secondary | ICD-10-CM

## 2024-04-30 NOTE — Progress Notes (Addendum)
 Patient ID: Scott Brennan, male    DOB: July 13, 1946, 78 y.o.   MRN: [id number]  This visit was conducted in person.  BP 138/84   Pulse (!) 54   Temp 97.7 F (36.5 C) (Oral)   Ht 5' 10 (1.778 m)   Wt 186 lb 9.6 oz (84.6 kg)   SpO2 99%   BMI 26.77 kg/m    CC:  Chief Complaint  Patient presents with   Foreign Body in Skin    Located in right heel    Subjective:   HPI: Scott Brennan is a 78 y.o. male presenting on 04/30/2024 for Foreign Body in Skin (Located in right heel)    Got wood splinter in heel. Removed a large portion. Area is still sore. Pain to walk .  No redness, no discharge.  Has been soaking in [employer].   No diabetes.       Relevant past medical, surgical, family and social history reviewed and updated as indicated. Interim medical history since our last visit reviewed. Allergies and medications reviewed and updated. Outpatient Medications Prior to Visit  Medication Sig Dispense Refill   atorvastatin  (LIPITOR) 20 MG tablet TAKE 1 TABLET (20 MG TOTAL) BY MOUTH EVERY EVENING. FOR CHOLESTEROL. 90 tablet 2   betamethasone , augmented, (DIPROLENE ) 0.05 % lotion Apply topically 2 (two) times daily. 30 mL 0   ciprofloxacin -dexamethasone  (CIPRODEX ) OTIC suspension Place 4 drops into the left ear 2 (two) times daily. 7.5 mL 0   meloxicam  (MOBIC ) 15 MG tablet Take 1 tablet (15 mg total) by mouth daily. 30 tablet 0   sildenafil  (VIAGRA ) 100 MG tablet Take 0.5-1 tablets (50-100 mg total) by mouth daily as needed for erectile dysfunction. 10 tablet 3   tamsulosin  (FLOMAX ) 0.4 MG CAPS capsule TAKE 1 CAPSULE BY MOUTH EVERY DAY 90 capsule 1   No facility-administered medications prior to visit.     Per HPI unless specifically indicated in ROS section below Review of Systems  Constitutional:  Negative for fatigue and fever.  HENT:  Negative for ear pain.   Eyes:  Negative for pain.  Respiratory:  Negative for cough and shortness of breath.   Cardiovascular:   Negative for chest pain, palpitations and leg swelling.  Gastrointestinal:  Negative for abdominal pain.  Genitourinary:  Negative for dysuria.  Musculoskeletal:  Negative for arthralgias.  Neurological:  Negative for syncope, light-headedness and headaches.  Psychiatric/Behavioral:  Negative for dysphoric mood.    Objective:  BP 138/84   Pulse (!) 54   Temp 97.7 F (36.5 C) (Oral)   Ht 5' 10 (1.778 m)   Wt 186 lb 9.6 oz (84.6 kg)   SpO2 99%   BMI 26.77 kg/m   Wt Readings from Last 3 Encounters:  04/30/24 186 lb 9.6 oz (84.6 kg)  07/05/23 187 lb 3.2 oz (84.9 kg)  07/04/23 178 lb (80.7 kg)      Physical Exam Constitutional:      Appearance: He is well-developed.  HENT:     Head: Normocephalic.     Right Ear: Hearing normal.     Left Ear: Hearing normal.     Nose: Nose normal.  Neck:     Thyroid : No thyroid  mass or thyromegaly.     Vascular: No carotid bruit.     Trachea: Trachea normal.  Cardiovascular:     Rate and Rhythm: Normal rate and regular rhythm.     Pulses: Normal pulses.     Heart sounds: Heart sounds  not distant. No murmur heard.    No friction rub. No gallop.     Comments: No peripheral edema Pulmonary:     Effort: Pulmonary effort is normal. No respiratory distress.     Breath sounds: Normal breath sounds.  Musculoskeletal:       Feet:  Feet:     Comments: Wooden splinter embedded in right heel in subcutaneous tissue Skin:    General: Skin is warm and dry.     Findings: No rash.  Psychiatric:        Speech: Speech normal.        Behavior: Behavior normal.        Thought Content: Thought content normal.       Results for orders placed or performed in visit on 07/05/23  CBC   Collection Time: 07/05/23  9:58 AM  Result Value Ref Range   WBC 4.2 4.0 - 10.5 K/uL   RBC 4.37 4.22 - 5.81 Mil/uL   Platelets 223.0 150.0 - 400.0 K/uL   Hemoglobin 13.3 13.0 - 17.0 g/dL   HCT 59.3 60.9 - 47.9 %   MCV 92.8 78.0 - 100.0 fl   MCHC 32.7 30.0 - 36.0  g/dL   RDW 85.9 88.4 - 84.4 %  Comprehensive metabolic panel   Collection Time: 07/05/23  9:58 AM  Result Value Ref Range   Sodium 140 135 - 145 mEq/L   Potassium 4.5 3.5 - 5.1 mEq/L   Chloride 103 96 - 112 mEq/L   CO2 31 19 - 32 mEq/L   Glucose, Bld 83 70 - 99 mg/dL   BUN 27 (H) 6 - 23 mg/dL   Creatinine, Ser 8.89 0.40 - 1.50 mg/dL   Total Bilirubin 0.6 0.2 - 1.2 mg/dL   Alkaline Phosphatase 78 39 - 117 U/L   AST 29 0 - 37 U/L   ALT 22 0 - 53 U/L   Total Protein 6.5 6.0 - 8.3 g/dL   Albumin 4.0 3.5 - 5.2 g/dL   GFR 35.33 >39.99 mL/min   Calcium  9.3 8.4 - 10.5 mg/dL  Lipid panel   Collection Time: 07/05/23  9:58 AM  Result Value Ref Range   Cholesterol 163 0 - 200 mg/dL   Triglycerides 45.9 0.0 - 149.0 mg/dL   HDL 34.79 >60.99 mg/dL   VLDL 89.1 0.0 - 59.9 mg/dL   LDL Cholesterol 87 0 - 99 mg/dL   Total CHOL/HDL Ratio 2    NonHDL 97.33   TSH   Collection Time: 07/05/23  9:58 AM  Result Value Ref Range   TSH 1.47 0.35 - 5.50 uIU/mL  PSA, Medicare   Collection Time: 07/05/23  9:58 AM  Result Value Ref Range   PSA 2.00 0.10 - 4.00 ng/ml  Urine Microscopic   Collection Time: 07/05/23  9:58 AM  Result Value Ref Range   WBC, UA 0-2/hpf 0-2/hpf   RBC / HPF 0-2/hpf 0-2/hpf   Squamous Epithelial / HPF Rare(0-4/hpf) Rare(0-4/hpf)    Assessment and Plan  Foreign body in skin Assessment & Plan: Area cleaned with iodine and alcohol.  Small wooden splinter removed with 11 blade scalpel, mild bleeding occurred.  Hemostasis achieved quickly.  Area applied with topical antibiotic and bandage. Patient reported improvement of pain.  Return and ER precautions provided, recommended keeping an eye out for possible signs and symptoms of infection.     No follow-ups on file.   Greig Ring, MD

## 2024-05-08 DIAGNOSIS — T148XXA Other injury of unspecified body region, initial encounter: Secondary | ICD-10-CM | POA: Insufficient documentation

## 2024-05-08 NOTE — Assessment & Plan Note (Addendum)
 Area cleaned with iodine and alcohol.  Small wooden splinter at a depth of  subcutaneous tissue removed with 11 blade scalpel, mild bleeding occurred.  Hemostasis achieved quickly.  Area applied with topical antibiotic and bandage. Patient reported improvement of pain.  Return and ER precautions provided, recommended keeping an eye out for possible signs and symptoms of infection.

## 2024-05-20 DIAGNOSIS — H90A32 Mixed conductive and sensorineural hearing loss, unilateral, left ear with restricted hearing on the contralateral side: Secondary | ICD-10-CM | POA: Diagnosis not present

## 2024-05-20 DIAGNOSIS — H60393 Other infective otitis externa, bilateral: Secondary | ICD-10-CM | POA: Diagnosis not present

## 2024-05-20 DIAGNOSIS — H6123 Impacted cerumen, bilateral: Secondary | ICD-10-CM | POA: Diagnosis not present

## 2024-05-21 ENCOUNTER — Other Ambulatory Visit: Payer: Self-pay | Admitting: Otolaryngology

## 2024-05-21 DIAGNOSIS — H918X3 Other specified hearing loss, bilateral: Secondary | ICD-10-CM

## 2024-06-02 ENCOUNTER — Ambulatory Visit
Admission: RE | Admit: 2024-06-02 | Discharge: 2024-06-02 | Disposition: A | Discharge status: Home / Self Care | Source: Ambulatory Visit | Attending: Otolaryngology | Admitting: Otolaryngology

## 2024-06-02 DIAGNOSIS — H918X3 Other specified hearing loss, bilateral: Secondary | ICD-10-CM

## 2024-06-15 ENCOUNTER — Other Ambulatory Visit: Payer: Self-pay | Admitting: Nurse Practitioner

## 2024-06-15 DIAGNOSIS — E782 Mixed hyperlipidemia: Secondary | ICD-10-CM

## 2024-06-16 NOTE — Telephone Encounter (Signed)
 [facility] needs CPE scheduled on 07/05/2024 or a few weeks after to continue getting refills

## 2024-06-17 ENCOUNTER — Ambulatory Visit
Admission: RE | Admit: 2024-06-17 | Discharge: 2024-06-17 | Disposition: A | Discharge status: Home / Self Care | Source: Ambulatory Visit | Attending: Otolaryngology | Admitting: Otolaryngology

## 2024-06-17 MED ORDER — GADOPICLENOL 0.5 MMOL/ML IV SOLN
10.0000 mL | Freq: Once | INTRAVENOUS | Status: AC | PRN
  Administered 2024-06-17: 9 mL via INTRAVENOUS

## 2024-06-28 ENCOUNTER — Other Ambulatory Visit: Payer: Self-pay | Admitting: Nurse Practitioner

## 2024-06-28 DIAGNOSIS — N529 Male erectile dysfunction, unspecified: Secondary | ICD-10-CM

## 2024-07-04 ENCOUNTER — Ambulatory Visit: Payer: Medicare Other

## 2024-07-04 VITALS — Ht 70.0 in | Wt 186.0 lb

## 2024-07-04 DIAGNOSIS — Z0000 Encounter for general adult medical examination without abnormal findings: Secondary | ICD-10-CM

## 2024-07-04 NOTE — Patient Instructions (Signed)
 [name],  Thank you for taking the time for your Medicare Wellness Visit. I appreciate your continued commitment to your health goals. Please review the care plan we discussed, and feel free to reach out if I can assist you further.  Please note that Annual Wellness Visits do not include a physical exam. Some assessments may be limited, especially if the visit was conducted virtually. If needed, we may recommend an in-person follow-up with your provider.  Ongoing Care Seeing your primary care provider every 3 to 6 months helps us  monitor your health and provide consistent, personalized care.   Referrals If a referral was made during today's visit and you haven't received any updates within two weeks, please contact the referred provider directly to check on the status.  Recommended Screenings:  Health Maintenance  Topic Date Due   Zoster (Shingles) Vaccine (1 of 2) Never done   Pneumococcal Vaccine for age over 24 (3 of 3 - PCV20 or PCV21) 08/20/2023   Medicare Annual Wellness Visit  07/03/2024   COVID-19 Vaccine (6 - 2025-26 season) 12/03/2024   DTaP/Tdap/Td vaccine (3 - Tdap) 08/19/2028   Flu Shot  Completed   Hepatitis C Screening  Completed   Meningitis B Vaccine  Aged Out   Colon Cancer Screening  Discontinued       07/04/2024    3:52 PM  Advanced Directives  Does Patient Have a Medical Advance Directive? Yes  Type of Estate Agent of [place];Living will  Copy of Healthcare Power of Attorney in Chart? Yes - validated most recent copy scanned in chart (See row information)    Vision: Annual vision screenings are recommended for early detection of glaucoma, cataracts, and diabetic retinopathy. These exams can also reveal signs of chronic conditions such as diabetes and high blood pressure.  Dental: Annual dental screenings help detect early signs of oral cancer, gum disease, and other conditions linked to overall health, including heart disease and  diabetes.
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
## 2024-07-08 ENCOUNTER — Ambulatory Visit (INDEPENDENT_AMBULATORY_CARE_PROVIDER_SITE_OTHER): Admitting: Nurse Practitioner

## 2024-07-08 ENCOUNTER — Encounter: Payer: Self-pay | Admitting: Nurse Practitioner

## 2024-07-08 VITALS — BP 128/60 | HR 49 | Temp 97.7°F | Ht 69.25 in | Wt 189.6 lb

## 2024-07-08 DIAGNOSIS — I10 Essential (primary) hypertension: Secondary | ICD-10-CM

## 2024-07-08 DIAGNOSIS — Z125 Encounter for screening for malignant neoplasm of prostate: Secondary | ICD-10-CM

## 2024-07-08 DIAGNOSIS — R351 Nocturia: Secondary | ICD-10-CM

## 2024-07-08 DIAGNOSIS — Z126 Encounter for screening for malignant neoplasm of bladder: Secondary | ICD-10-CM

## 2024-07-08 DIAGNOSIS — N400 Enlarged prostate without lower urinary tract symptoms: Secondary | ICD-10-CM

## 2024-07-08 DIAGNOSIS — L219 Seborrheic dermatitis, unspecified: Secondary | ICD-10-CM

## 2024-07-08 DIAGNOSIS — N529 Male erectile dysfunction, unspecified: Secondary | ICD-10-CM

## 2024-07-08 DIAGNOSIS — I700 Atherosclerosis of aorta: Secondary | ICD-10-CM

## 2024-07-08 DIAGNOSIS — Z23 Encounter for immunization: Secondary | ICD-10-CM

## 2024-07-08 DIAGNOSIS — E782 Mixed hyperlipidemia: Secondary | ICD-10-CM

## 2024-07-08 DIAGNOSIS — Z0000 Encounter for general adult medical examination without abnormal findings: Secondary | ICD-10-CM

## 2024-07-08 LAB — COMPREHENSIVE METABOLIC PANEL WITH GFR
ALT: 21 U/L (ref 0–53)
AST: 26 U/L (ref 0–37)
Albumin: 3.9 g/dL (ref 3.5–5.2)
Alkaline Phosphatase: 75 U/L (ref 39–117)
BUN: 30 mg/dL — ABNORMAL HIGH (ref 6–23)
CO2: 34 meq/L — ABNORMAL HIGH (ref 19–32)
Calcium: 9.1 mg/dL (ref 8.4–10.5)
Chloride: 104 meq/L (ref 96–112)
Creatinine, Ser: 1.24 mg/dL (ref 0.40–1.50)
GFR: 55.61 mL/min — ABNORMAL LOW (ref >60.00)
Glucose, Bld: 85 mg/dL (ref 70–99)
Potassium: 4.7 meq/L (ref 3.5–5.1)
Sodium: 140 meq/L (ref 135–145)
Total Bilirubin: 0.7 mg/dL (ref 0.2–1.2)
Total Protein: 6.4 g/dL (ref 6.0–8.3)

## 2024-07-08 LAB — LIPID PANEL
Cholesterol: 143 mg/dL (ref 0–200)
HDL: 61.4 mg/dL (ref >39.00)
LDL Cholesterol: 74 mg/dL (ref 0–99)
NonHDL: 81.78
Total CHOL/HDL Ratio: 2
Triglycerides: 39 mg/dL (ref 0.0–149.0)
VLDL: 7.8 mg/dL (ref 0.0–40.0)

## 2024-07-08 LAB — CBC WITH DIFFERENTIAL/PLATELET
Basophils Absolute: 0 K/uL (ref 0.0–0.1)
Basophils Relative: 0.7 % (ref 0.0–3.0)
Eosinophils Absolute: 0.1 K/uL (ref 0.0–0.7)
Eosinophils Relative: 2.1 % (ref 0.0–5.0)
HCT: 36.6 % — ABNORMAL LOW (ref 39.0–52.0)
Hemoglobin: 12.3 g/dL — ABNORMAL LOW (ref 13.0–17.0)
Lymphocytes Relative: 18.5 % (ref 12.0–46.0)
Lymphs Abs: 1 K/uL (ref 0.7–4.0)
MCHC: 33.7 g/dL (ref 30.0–36.0)
MCV: 89.4 fl (ref 78.0–100.0)
Monocytes Absolute: 0.6 K/uL (ref 0.1–1.0)
Monocytes Relative: 11.1 % (ref 3.0–12.0)
Neutro Abs: 3.6 K/uL (ref 1.4–7.7)
Neutrophils Relative %: 67.6 % (ref 43.0–77.0)
Platelets: 225 K/uL (ref 150.0–400.0)
RBC: 4.1 Mil/uL — ABNORMAL LOW (ref 4.22–5.81)
RDW: 14.3 % (ref 11.5–15.5)
WBC: 5.3 K/uL (ref 4.0–10.5)

## 2024-07-08 LAB — URINALYSIS, MICROSCOPIC ONLY

## 2024-07-08 LAB — PSA, MEDICARE: PSA: 1.86 ng/mL (ref 0.10–4.00)

## 2024-07-08 LAB — TSH: TSH: 1.11 u[IU]/mL (ref 0.35–5.50)

## 2024-07-08 MED ORDER — TAMSULOSIN HCL 0.4 MG PO CAPS
0.8000 mg | ORAL_CAPSULE | Freq: Every day | ORAL | Status: AC
Start: 2024-07-08 — End: ?

## 2024-07-08 MED ORDER — ATORVASTATIN CALCIUM 20 MG PO TABS: 20.0000 mg | ORAL_TABLET | Freq: Every evening | ORAL | 3 refills | Status: AC

## 2024-07-08 MED ORDER — SILDENAFIL CITRATE 100 MG PO TABS: 100.0000 mg | ORAL_TABLET | ORAL | 9 refills | Status: AC | PRN

## 2024-07-08 MED ORDER — BETAMETHASONE DIPROPIONATE AUG 0.05 % EX LOTN: TOPICAL_LOTION | Freq: Two times a day (BID) | CUTANEOUS | 0 refills | Status: AC

## 2024-07-08 NOTE — Assessment & Plan Note (Signed)
 PSA today.  Continue tamsulosin  0.8 mg nightly

## 2024-07-08 NOTE — Assessment & Plan Note (Signed)
 Maintained on atorvastatin  20 mg daily pending lipid panel today.

## 2024-07-08 NOTE — Patient Instructions (Signed)
 Nice to see you today I will be in touch with the labs once I have them Follow up with me in 1 year, sooner if you need me

## 2024-07-08 NOTE — Assessment & Plan Note (Signed)
 Patient currently maintained on atorvastatin  20 mg daily continue.  Pending lipid panel

## 2024-07-08 NOTE — Assessment & Plan Note (Signed)
 Blood pressure currently controlled off of antihypertensive medications.  Continue healthy lifestyle modifications

## 2024-07-08 NOTE — Assessment & Plan Note (Signed)
 Discussed age-appropriate immunizations and screening exams.  Did review patient's personal, surgical, social, family histories.  Patient is up-to-date on all age-appropriate vaccinations he would like.  Update pneumonia vaccine today.  Patient is aged out of CRC screening.  PSA for prostate cancer screening today.  Patient was given information at discharge about preventative healthcare maintenance with anticipatory guidance.

## 2024-07-08 NOTE — Assessment & Plan Note (Signed)
 Patient will use betamethasone  lotion as needed.  Refill provided

## 2024-07-08 NOTE — Progress Notes (Signed)
 Established Patient Office Visit  Subjective   Patient ID: Scott Brennan, male    DOB: 03/08/1946  Age: 78 y.o. MRN: [id number]  Chief Complaint  Patient presents with   Annual Exam   Medication Refill    Atorvastatin , flomax , viagra , betamethasone        HTN: Historical diagnosis not currently maintained on any antihypertensives  ED/BPH: currently on tamsulosin . State that he feels like does not empty completely and has 3-4 times at ngiht. States that he will urinate in the morning and then have to come back in the morning   HLD: Patient currently maintained on atorvastatin  20 mg daily  Dermatitis of the scalp: Patient will use Betamethasone  augmented lotion as needed  for complete physical and follow up of chronic conditions.  Immunizations: -Tetanus: Completed in 2020 -Influenza: 05/05/2024 -Shingles: Get local pharmacy -Pneumonia: Completed 2020,, update Prevnar 20 today  Diet: Fair diet. He is eating 2 meals a day. He was eating 3 but cut back. He will have a later in the day and will have cup of cereal. He does coffee, juice, tea, and some water  Exercise:  Going to the gym 5 times a day and then will walk  Eye exam: Completes annually. Wears glasses   Dental exam: Completes semi-annually    Colonoscopy: Completed in 03/06/2017, no repeat due to age Lung Cancer Screening: Completed in does not qualify   PSA: Due  Sleep:going to bed aroudn 9-10 and will get up aruond 5-530.  Advanced directive : on file 04/12/2022        Review of Systems  Constitutional:  Negative for chills and fever.  Respiratory:  Negative for shortness of breath.   Cardiovascular:  Negative for chest pain and leg swelling.  Gastrointestinal:  Negative for abdominal pain, blood in stool, constipation, diarrhea, nausea and vomiting.       BM daily   Genitourinary:  Negative for dysuria and hematuria.  Neurological:  Negative for dizziness, tingling and headaches.   Psychiatric/Behavioral:  Negative for hallucinations and suicidal ideas.       Objective:     BP 128/60   Pulse (!) 49   Temp 97.7 F (36.5 C) (Oral)   Ht 5' 9.25 (1.759 m)   Wt 189 lb 9.6 oz (86 kg)   SpO2 97%   BMI 27.80 kg/m  BP Readings from Last 3 Encounters:  07/08/24 128/60  04/30/24 138/84  07/05/23 (!) 146/80   Wt Readings from Last 3 Encounters:  07/08/24 189 lb 9.6 oz (86 kg)  07/04/24 186 lb (84.4 kg)  04/30/24 186 lb 9.6 oz (84.6 kg)      Physical Exam Vitals and nursing note reviewed.  Constitutional:      Appearance: Normal appearance.  HENT:     Right Ear: Tympanic membrane, ear canal and external ear normal.     Left Ear: Tympanic membrane, ear canal and external ear normal.     Mouth/Throat:     Mouth: Mucous membranes are moist.     Pharynx: Oropharynx is clear.  Eyes:     Extraocular Movements: Extraocular movements intact.     Pupils: Pupils are equal, round, and reactive to light.  Cardiovascular:     Rate and Rhythm: Normal rate and regular rhythm.     Pulses: Normal pulses.     Heart sounds: Normal heart sounds.  Pulmonary:     Effort: Pulmonary effort is normal.     Breath sounds: Normal breath sounds.  Abdominal:  General: Bowel sounds are normal. There is no distension.     Palpations: There is no mass.     Tenderness: There is no abdominal tenderness.     Hernia: No hernia is present.  Musculoskeletal:     Right lower leg: No edema.     Left lower leg: No edema.  Lymphadenopathy:     Cervical: No cervical adenopathy.  Skin:    General: Skin is warm.  Neurological:     General: No focal deficit present.     Mental Status: He is alert.     Deep Tendon Reflexes:     Reflex Scores:      Bicep reflexes are 2+ on the right side and 2+ on the left side.      Patellar reflexes are 2+ on the right side and 2+ on the left side.    Comments: Bilateral upper and lower extremity strength 5/5  Psychiatric:        Mood and  Affect: Mood normal.        Behavior: Behavior normal.        Thought Content: Thought content normal.        Judgment: Judgment normal.      No results found for any visits on 07/08/24.    The 10-year ASCVD risk score (Arnett DK, et al., 2019) is: 26.4%    Assessment & Plan:   Problem List Items Addressed This Visit       Cardiovascular and Mediastinum   Essential hypertension   Blood pressure currently controlled off of antihypertensive medications.  Continue healthy lifestyle modifications      Relevant Medications   sildenafil  (VIAGRA ) 100 MG tablet   atorvastatin  (LIPITOR) 20 MG tablet   Other Relevant Orders   CBC with Differential/Platelet   Comprehensive metabolic panel with GFR   Lipid panel   TSH   Aortic atherosclerosis   Patient currently maintained on atorvastatin  20 mg daily continue.  Pending lipid panel      Relevant Medications   sildenafil  (VIAGRA ) 100 MG tablet   atorvastatin  (LIPITOR) 20 MG tablet   Other Relevant Orders   Lipid panel     Musculoskeletal and Integument   Seborrheic dermatitis of scalp   Patient will use betamethasone  lotion as needed.  Refill provided      Relevant Medications   betamethasone , augmented, (DIPROLENE ) 0.05 % lotion     Other   ED (erectile dysfunction)   Currently maintained on sildenafil  100 mg daily as needed.  Continue      Relevant Medications   sildenafil  (VIAGRA ) 100 MG tablet   Mixed hyperlipidemia   Maintained on atorvastatin  20 mg daily pending lipid panel today.      Relevant Medications   sildenafil  (VIAGRA ) 100 MG tablet   atorvastatin  (LIPITOR) 20 MG tablet   Preventative health care - Primary   Discussed age-appropriate immunizations and screening exams.  Did review patient's personal, surgical, social, family histories.  Patient is up-to-date on all age-appropriate vaccinations he would like.  Update pneumonia vaccine today.  Patient is aged out of CRC screening.  PSA for prostate  cancer screening today.  Patient was given information at discharge about preventative healthcare maintenance with anticipatory guidance.      Relevant Orders   CBC with Differential/Platelet   Comprehensive metabolic panel with GFR   TSH   Nocturia   Currently maintained on Flomax  0.4 mg daily.  Not as effective as we would like.  Will increase tamsulosin  to  0.8 mg daily.      Relevant Medications   tamsulosin  (FLOMAX ) 0.4 MG CAPS capsule   Enlarged prostate   PSA today.  Continue tamsulosin  0.8 mg nightly      Relevant Medications   tamsulosin  (FLOMAX ) 0.4 MG CAPS capsule   Other Visit Diagnoses       Need for vaccination against Streptococcus pneumoniae       Relevant Orders   Pneumococcal conjugate vaccine 20-valent (Prevnar 20) (Completed)     Screening for prostate cancer       Relevant Orders   PSA, Medicare     Screening for bladder cancer       Relevant Orders   Urine Microscopic       Return in about 1 year (around 07/08/2025) for CPE and Labs.    Adina Crandall, NP

## 2024-07-08 NOTE — Assessment & Plan Note (Signed)
 Currently maintained on Flomax  0.4 mg daily.  Not as effective as we would like.  Will increase tamsulosin  to 0.8 mg daily.

## 2024-07-08 NOTE — Assessment & Plan Note (Signed)
 Currently maintained on sildenafil  100 mg daily as needed.  Continue

## 2024-07-15 ENCOUNTER — Ambulatory Visit: Payer: Self-pay | Admitting: Nurse Practitioner

## 2024-08-20 ENCOUNTER — Telehealth: Payer: Self-pay

## 2024-08-20 NOTE — Telephone Encounter (Signed)
 I have patient taking tamsulosin  0.4 mg 2 capsules daily. That is the max dose.  Can we clarify the message please

## 2024-08-20 NOTE — Telephone Encounter (Signed)
 Copied from CRM #[id number]. Topic: Clinical - Medical Advice >> Aug 20, 2024 10:59 AM Ivette P wrote: Reason for CRM: Pt called in to notify about medication change.   Tamsulosin  - medication working the best. On a dosage of 1 a day and has been taking 2 a day and wanted to notify that the dosage change helped.    Pt would like to know if this change can be made as discussed

## 2024-08-22 NOTE — Telephone Encounter (Signed)
 Contacted and spoke with patient.  Pt states that he stopped taking 2 capsules a day due to leg cramping/pain.  Pt states that he is doing good with taking 0.4 mg once daily.

## 2025-07-13 ENCOUNTER — Encounter: Admitting: Nurse Practitioner

## 2025-07-13 ENCOUNTER — Ambulatory Visit
